# Patient Record
Sex: Female | Born: 1995 | Hispanic: Yes | Marital: Single | Smoking: Never smoker
Health system: Southern US, Community
[De-identification: ages and names within clinical notes are randomized; demographics above are authoritative.]

## PROBLEM LIST (undated history)

## (undated) DIAGNOSIS — A749 Chlamydial infection, unspecified: Secondary | ICD-10-CM

## (undated) DIAGNOSIS — K3580 Unspecified acute appendicitis: Secondary | ICD-10-CM

## (undated) HISTORY — DX: Unspecified acute appendicitis: K35.80

## (undated) HISTORY — DX: Chlamydial infection, unspecified: A74.9

---
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
## 2015-04-03 ENCOUNTER — Encounter: Payer: Self-pay | Admitting: Emergency Medicine

## 2015-04-03 ENCOUNTER — Ambulatory Visit
Admission: EM | Admit: 2015-04-03 | Discharge: 2015-04-03 | Disposition: A | Discharge status: Home / Self Care | Payer: Self-pay | Attending: Family Medicine | Admitting: Family Medicine

## 2015-04-03 DIAGNOSIS — J039 Acute tonsillitis, unspecified: Secondary | ICD-10-CM

## 2015-04-03 DIAGNOSIS — H6981 Other specified disorders of Eustachian tube, right ear: Secondary | ICD-10-CM

## 2015-04-03 LAB — CBC WITH DIFFERENTIAL/PLATELET
BASOS ABS: 0 10*3/uL (ref 0–0.1)
BASOS PCT: 0 %
EOS PCT: 1 %
Eosinophils Absolute: 0.1 10*3/uL (ref 0–0.7)
HCT: 34.7 % — ABNORMAL LOW (ref 35.0–47.0)
Hemoglobin: 11.3 g/dL — ABNORMAL LOW (ref 12.0–16.0)
Lymphocytes Relative: 18 %
Lymphs Abs: 1.7 10*3/uL (ref 1.0–3.6)
MCH: 26 pg (ref 26.0–34.0)
MCHC: 32.5 g/dL (ref 32.0–36.0)
MCV: 80 fL (ref 80.0–100.0)
MONO ABS: 0.7 10*3/uL (ref 0.2–0.9)
Monocytes Relative: 7 %
Neutro Abs: 6.7 10*3/uL — ABNORMAL HIGH (ref 1.4–6.5)
Neutrophils Relative %: 74 %
PLATELETS: 349 10*3/uL (ref 150–440)
RBC: 4.34 MIL/uL (ref 3.80–5.20)
RDW: 17 % — AB (ref 11.5–14.5)
WBC: 9.2 10*3/uL (ref 3.6–11.0)

## 2015-04-03 LAB — RAPID STREP SCREEN (MED CTR MEBANE ONLY): Streptococcus, Group A Screen (Direct): NEGATIVE

## 2015-04-03 LAB — MONONUCLEOSIS SCREEN: MONO SCREEN: NEGATIVE

## 2015-04-03 MED ORDER — FLUTICASONE PROPIONATE 50 MCG/ACT NA SUSP: 2.0000 | Freq: Every day | NASAL | Status: DC

## 2015-04-03 MED ORDER — KETOROLAC TROMETHAMINE 60 MG/2ML IM SOLN
60.0000 mg | Freq: Once | INTRAMUSCULAR | Status: AC
  Administered 2015-04-03: 60 mg via INTRAMUSCULAR

## 2015-04-03 MED ORDER — AZITHROMYCIN 250 MG PO TABS: 500.0000 mg | ORAL_TABLET | Freq: Every day | ORAL | Status: DC

## 2015-04-03 NOTE — Discharge Instructions (Signed)
° °  Call in 3 days for final Strep report  Azithromycin 2 tablets daily for 5 days  Ibuprofen 600 mg ( 3 x 200mg  ) 3 times a day with meals for 3-4 days  Alternate with Tylenol 500 mg 2 tablets if desired  Warm salt water gargles  Tonsillitis Tonsillitis is an infection of the throat. This infection causes the tonsils to become red, tender, and puffy (swollen). Tonsils are groups of tissue at the back of your throat. If bacteria caused your infection, antibiotic medicine will be given to you. Sometimes symptoms of tonsillitis can be relieved with the use of steroid medicine. If your tonsillitis is severe and happens often, you may need to get your tonsils removed (tonsillectomy). HOME CARE   Rest and sleep often.  Drink enough fluids to keep your pee (urine) clear or pale yellow.  While your throat is sore, eat soft or liquid foods like:  Soup.  Ice cream.  Instant breakfast drinks.  Eat frozen ice pops.  Gargle with a warm or cold liquid to help soothe the throat. Gargle with a water and salt mix. Mix 1/4 teaspoon of salt and 1/4 teaspoon of baking soda in 1 cup of water.  Only take medicines as told by your doctor.  If you are given medicines (antibiotics), take them as told. Finish them even if you start to feel better. GET HELP IF:  You have large, tender lumps in your neck.  You have a rash.  You cough up green, yellow-brown, or bloody fluid.  You cannot swallow liquids or food for 24 hours.  You notice that only one of your tonsils is swollen. GET HELP RIGHT AWAY IF:   You throw up (vomit).  You have a very bad headache.  You have a stiff neck.  You have chest pain.  You have trouble breathing or swallowing.  You have bad throat pain, drooling, or your voice changes.  You have bad pain not helped by medicine.  You cannot fully open your mouth.  You have redness, puffiness, or bad pain in the neck.  You have a fever. MAKE SURE YOU:   Understand  these instructions.  Will watch your condition.  Will get help right away if you are not doing well or get worse.   This information is not intended to replace advice given to you by your health care provider. Make sure you discuss any questions you have with your health care provider.   Document Released: 11/03/2007 Document Revised: 05/22/2013 Document Reviewed: 11/03/2012 Elsevier Interactive Patient Education [employer]2016 Elsevier Inc.

## 2015-04-03 NOTE — ED Notes (Signed)
Pt with a sore throat x 1 week

## 2015-04-03 NOTE — ED Provider Notes (Signed)
CSN: [id number]     Arrival date & time 04/03/15  1400 History   First MD Initiated Contact with Patient 04/03/15 1505     Chief Complaint  Patient presents with  . Sore Throat   (Consider location/radiation/quality/duration/timing/severity/associated sxs/prior Treatment) HPI  19 yo F reports a week of right sided sore throat pain. Last week the left side hurt but that has resolved. Mother told her  she had a fever- no idea what. Using warm tea/honey- no ibuprofen or tylenol. Appetite is decreased, doesn't want to eat because of pain, Can drink but uncomfortable. Voiding. Right sided ear pain and eustachian tube dysfunction. Denies hx of seasonal allergies. Denies friends with similar symptoms. Attends [employer] and has missed classes. Boyfriend present in room. History reviewed. No pertinent past medical history. History reviewed. No pertinent past surgical history. Family History  Problem Relation Age of Onset  . Diabetes Maternal Grandmother    Social History  Substance Use Topics  . Smoking status: Never Smoker   . Smokeless tobacco: None  . Alcohol Use: No   OB History    No data available     Review of Systems Constitutional: No fever today Eyes: No visual changes. WUJ:WJXB sore throat, right side;  Cardiovascular:Negative for chest pain/palpitations Respiratory: Negative for shortness of breath Gastrointestinal: No abdominal pain. No nausea,vomiting, diarrhea Genitourinary: Negative for dysuria. Normal urination. Musculoskeletal: Negative for back pain. FROM extremities without pain Skin: Negative for rash Neurological: Negative for headache, focal weakness or numbness  Allergies  Review of patient's allergies indicates no known allergies.  Home Medications   Prior to Admission medications   Medication Sig Start Date End Date Taking? Authorizing Provider  azithromycin (ZITHROMAX) 250 MG tablet Take 2 tablets (500 mg total) by mouth daily. Take first 2 tablets together  for 5 days 04/03/15   Rae Halsted, PA-C  fluticasone [facility]) 50 MCG/ACT nasal spray Place 2 sprays into both nostrils daily. To open passages and help relieve right ear pressure 04/03/15   Rae Halsted, PA-C   Meds Ordered and Administered this Visit   Medications  ketorolac (TORADOL) injection 60 mg (60 mg Intramuscular Given 04/03/15 1525)  well tolerated with good pain relief in about 30 minutes  BP 101/60 mmHg  Pulse 81  Temp(Src) 97.9 F (36.6 C) (Tympanic)  Resp 18  Ht 5' 5.5" (1.664 m)  Wt 165 lb (74.844 kg)  BMI 27.03 kg/m2  SpO2 100%  LMP 03/07/2015 No data found.   Physical Exam Constitutional: Alert and oriented, complaining of sore throart, VS are noted,  General : Mild distress; Head:normocephalic, atraumatic,  Eyes: conjugate gaze,negative conjunctiva,  Ears:Grossly normal hearing,valsalva yields pop/squeak pain right ear, Right TM retracted,  Bilateral canals  WNL  Nose:normal Mouth/throat :Mucous membranes moist, Grossly enlarged right tonsillar pillar, left moderately enlarged; no exudate noted either Neck :  supple , tender anterior lymphadenopathy; post chain negative Heart: Normal rate, regular rhythm p96 Lung:    Normal respiratory effort and rate , no distress Back:    No CVAT, no spinal tenderness noted Abd :    soft, non-tender, bowel sounds present, no guarding,rebound or organomegaly  Appreciated, no splenic tenderness MSK:   nontender, normal ROM all extremities; ambulatory in unit, on and off table without assistance Neuro:Face symmetric, EOMI, PERRLA,tongue midline. Grossly intact; good attention and recall,normal gait, normal speech and language Skin:  Warm,dry,intact Psych: mood and affect WNL  ED Course  Procedures (including critical care time)  Labs Review Labs Reviewed  CBC WITH DIFFERENTIAL/PLATELET - Abnormal; Notable for the following:    Hemoglobin 11.3 (*)    HCT 34.7 (*)    RDW 17.0 (*)    Neutro Abs 6.7 (*)    All other  components within normal limits  RAPID STREP SCREEN (NOT AT [facility])  CULTURE, GROUP A STREP ([facility] ONLY)  MONONUCLEOSIS SCREEN   Results for orders placed or performed during the hospital encounter of 04/03/15  Rapid strep screen  Result Value Ref Range   Streptococcus, Group A Screen (Direct) NEGATIVE NEGATIVE  CBC with Differential  Result Value Ref Range   WBC 9.2 3.6 - 11.0 K/uL   RBC 4.34 3.80 - 5.20 MIL/uL   Hemoglobin 11.3 (L) 12.0 - 16.0 g/dL   HCT 40.934.7 (L) 81.135.0 - 91.447.0 %   MCV 80.0 80.0 - 100.0 fL   MCH 26.0 26.0 - 34.0 pg   MCHC 32.5 32.0 - 36.0 g/dL   RDW 78.217.0 (H) 95.611.5 - 21.314.5 %   Platelets 349 150 - 440 K/uL   Neutrophils Relative % 74 %   Neutro Abs 6.7 (H) 1.4 - 6.5 K/uL   Lymphocytes Relative 18 %   Lymphs Abs 1.7 1.0 - 3.6 K/uL   Monocytes Relative 7 %   Monocytes Absolute 0.7 0.2 - 0.9 K/uL   Eosinophils Relative 1 %   Eosinophils Absolute 0.1 0 - 0.7 K/uL   Basophils Relative 0 %   Basophils Absolute 0.0 0 - 0.1 K/uL  Mononucleosis screen  Result Value Ref Range   Mono Screen NEGATIVE NEGATIVE     Imaging Review No results found.      MDM   1. Tonsillitis   2. Eustachian tube dysfunction, right    Plan: Test results and diagnosis reviewed with patient Reminded to call in 3 days for strep culture final Rx as per orders;  benefits, risks, potential side effects reviewed   Recommend supportive treatment with ibuprofen and acetominophen cyclic Increase po fluids, steamy shower, vaporizer Seek additional medical care if symptoms worsen or are not improving  Discharge Medication List as of 04/03/2015  4:04 PM    START taking these medications   Details  azithromycin (ZITHROMAX) 250 MG tablet Take 2 tablets (500 mg total) by mouth daily. Take first 2 tablets together for 5 days, Starting 04/03/2015, Until Discontinued, Normal      Fluticasone nasal spray 2 sparys BID for 3 days , then reduce to qd  Rae HalstedLaurie W Necie Wilcoxson, PA-C 04/03/15 2252

## 2015-04-06 LAB — CULTURE, GROUP A STREP (THRC)

## 2015-04-18 ENCOUNTER — Telehealth: Payer: Self-pay

## 2015-04-18 NOTE — ED Notes (Signed)
Patient contacted and informed had positive strep throat. States "the medicine", Zithromax, had helped. "I feel great"

## 2017-02-14 ENCOUNTER — Encounter: Payer: Self-pay | Admitting: Emergency Medicine

## 2017-02-14 ENCOUNTER — Observation Stay
Admission: EM | Admit: 2017-02-14 | Discharge: 2017-02-15 | Disposition: A | Discharge status: Home / Self Care | Payer: Commercial Managed Care - PPO | Attending: Surgery | Admitting: Surgery

## 2017-02-14 ENCOUNTER — Emergency Department: Payer: Commercial Managed Care - PPO

## 2017-02-14 DIAGNOSIS — K3580 Unspecified acute appendicitis: Secondary | ICD-10-CM | POA: Diagnosis not present

## 2017-02-14 DIAGNOSIS — K353 Acute appendicitis with localized peritonitis, without perforation or gangrene: Secondary | ICD-10-CM | POA: Diagnosis present

## 2017-02-14 LAB — URINALYSIS, COMPLETE (UACMP) WITH MICROSCOPIC
BILIRUBIN URINE: NEGATIVE
Bacteria, UA: NONE SEEN
Glucose, UA: NEGATIVE mg/dL
Hgb urine dipstick: NEGATIVE
Ketones, ur: 20 mg/dL — AB
LEUKOCYTES UA: NEGATIVE
NITRITE: NEGATIVE
PROTEIN: 30 mg/dL — AB
Specific Gravity, Urine: 1.027 (ref 1.005–1.030)
pH: 6 (ref 5.0–8.0)

## 2017-02-14 LAB — COMPREHENSIVE METABOLIC PANEL
ALK PHOS: 76 U/L (ref 38–126)
ALT: 27 U/L (ref 14–54)
ANION GAP: 8 (ref 5–15)
AST: 31 U/L (ref 15–41)
Albumin: 4.4 g/dL (ref 3.5–5.0)
BILIRUBIN TOTAL: 0.4 mg/dL (ref 0.3–1.2)
BUN: 10 mg/dL (ref 6–20)
CALCIUM: 9.2 mg/dL (ref 8.9–10.3)
CO2: 24 mmol/L (ref 22–32)
CREATININE: 0.61 mg/dL (ref 0.44–1.00)
Chloride: 105 mmol/L (ref 101–111)
GFR calc non Af Amer: >60 mL/min (ref >60)
Glucose, Bld: 147 mg/dL — ABNORMAL HIGH (ref 65–99)
Potassium: 3.9 mmol/L (ref 3.5–5.1)
Sodium: 137 mmol/L (ref 135–145)
TOTAL PROTEIN: 8 g/dL (ref 6.5–8.1)

## 2017-02-14 LAB — CBC
HCT: 39.5 % (ref 35.0–47.0)
HEMOGLOBIN: 13.3 g/dL (ref 12.0–16.0)
MCH: 30.4 pg (ref 26.0–34.0)
MCHC: 33.6 g/dL (ref 32.0–36.0)
MCV: 90.6 fL (ref 80.0–100.0)
PLATELETS: 248 10*3/uL (ref 150–440)
RBC: 4.36 MIL/uL (ref 3.80–5.20)
RDW: 14.1 % (ref 11.5–14.5)
WBC: 15.9 10*3/uL — AB (ref 3.6–11.0)

## 2017-02-14 LAB — LIPASE, BLOOD: Lipase: 18 U/L (ref 11–51)

## 2017-02-14 LAB — POCT PREGNANCY, URINE: PREG TEST UR: NEGATIVE

## 2017-02-14 LAB — PREGNANCY, URINE: Preg Test, Ur: NEGATIVE

## 2017-02-14 MED ORDER — IOPAMIDOL (ISOVUE-300) INJECTION 61%
100.0000 mL | Freq: Once | INTRAVENOUS | Status: AC | PRN
  Administered 2017-02-14: 100 mL via INTRAVENOUS
  Filled 2017-02-14: qty 100

## 2017-02-14 MED ORDER — ENOXAPARIN SODIUM 40 MG/0.4ML ~~LOC~~ SOLN
40.0000 mg | SUBCUTANEOUS | Status: DC
  Administered 2017-02-15: 40 mg via SUBCUTANEOUS
  Filled 2017-02-14: qty 0.4

## 2017-02-14 MED ORDER — KETOROLAC TROMETHAMINE 30 MG/ML IJ SOLN
30.0000 mg | Freq: Four times a day (QID) | INTRAMUSCULAR | Status: DC
  Administered 2017-02-15 (×2): 30 mg via INTRAVENOUS
  Filled 2017-02-14 (×2): qty 1

## 2017-02-14 MED ORDER — IOPAMIDOL (ISOVUE-300) INJECTION 61%
30.0000 mL | Freq: Once | INTRAVENOUS | Status: AC
  Administered 2017-02-14: 30 mL via ORAL
  Filled 2017-02-14: qty 30

## 2017-02-14 MED ORDER — LACTATED RINGERS IV SOLN
125.0000 mL/h | INTRAVENOUS | Status: DC
  Administered 2017-02-15: 125 mL/h via INTRAVENOUS

## 2017-02-14 MED ORDER — HYDROMORPHONE HCL 1 MG/ML IJ SOLN: 0.5000 mg | INTRAMUSCULAR | Status: DC | PRN

## 2017-02-14 MED ORDER — PIPERACILLIN-TAZOBACTAM 3.375 G IVPB 30 MIN
INTRAVENOUS | Status: AC
Start: 2017-02-14 — End: 2017-02-14
  Administered 2017-02-14: 3.375 g via INTRAVENOUS
  Filled 2017-02-14: qty 50

## 2017-02-14 MED ORDER — PIPERACILLIN-TAZOBACTAM 3.375 G IVPB
3.3750 g | Freq: Three times a day (TID) | INTRAVENOUS | Status: DC
  Administered 2017-02-15: 3.375 g via INTRAVENOUS
  Filled 2017-02-14: qty 50

## 2017-02-14 MED ORDER — ONDANSETRON HCL 4 MG/2ML IJ SOLN: 4.0000 mg | Freq: Four times a day (QID) | INTRAMUSCULAR | Status: DC | PRN

## 2017-02-14 MED ORDER — PANTOPRAZOLE SODIUM 40 MG IV SOLR
40.0000 mg | Freq: Every day | INTRAVENOUS | Status: DC
  Administered 2017-02-15: 40 mg via INTRAVENOUS
  Filled 2017-02-14: qty 40

## 2017-02-14 MED ORDER — ONDANSETRON 4 MG PO TBDP: 4.0000 mg | ORAL_TABLET | Freq: Four times a day (QID) | ORAL | Status: DC | PRN

## 2017-02-14 MED ORDER — PIPERACILLIN-TAZOBACTAM 3.375 G IVPB 30 MIN
3.3750 g | Freq: Once | INTRAVENOUS | Status: AC
  Administered 2017-02-14: 3.375 g via INTRAVENOUS

## 2017-02-14 NOTE — H&P (Signed)
Date of Admission:  02/14/2017  Reason for Admission:  Acute appendicitis  History of Present Illness: Diane Beasley is a 21 y.o. female who presents with a one day history of abdominal pain.  Pain started this morning when she woke up.  Initially it was in the left lower quadrant.  She had a bowel movement but it did not help with her pain.  Her mother then made her some tea which she ended up throwing up.  Her pain over the course of the day has migrated towards the right lower quadrant.  She reports having some chills at home and the ED.  Did have another episode of emesis in the ED after drinking the contrast.  Denies other areas of pain.  Denies chest pain or shortness of breath, but does hurt in the right lower quadrant when taking a deep breath.  Denies hematuria, dysuria, constipation or diarrhea.  Past Medical History: -None  Past Surgical History: -Left shoulder surgery for superficial clot  Home Medications: Prior to Admission medications   None    Allergies: No Known Allergies  Social History:  reports that she has never smoked. She has never used smokeless tobacco. She reports that she does not drink alcohol or use drugs.   Family History: Family History  Problem Relation Age of Onset  . Diabetes Maternal Grandmother     Review of Systems: Review of Systems  Constitutional: Positive for chills. Negative for fever.  HENT: Negative for hearing loss.   Eyes: Negative for blurred vision.  Respiratory: Negative for cough and shortness of breath.   Cardiovascular: Negative for chest pain.  Gastrointestinal: Positive for abdominal pain, nausea and vomiting. Negative for constipation and diarrhea.  Genitourinary: Negative for dysuria.  Musculoskeletal: Negative for myalgias.  Skin: Negative for rash.  Neurological: Negative for dizziness.  Psychiatric/Behavioral: Negative for depression.  All other systems reviewed and are negative.   Physical  Exam BP 116/67 (BP Location: Right Arm)   Pulse (!) 57   Temp 98 F (36.7 C) (Oral)   Resp 16   Wt 89.3 kg (196 lb 14.3 oz)   LMP 01/12/2017   SpO2 100%   BMI 32.27 kg/m  CONSTITUTIONAL: No acute distress HEENT:  Normocephalic, atraumatic, extraocular motion intact. NECK: Trachea is midline, and there is no jugular venous distension.  RESPIRATORY:  Lungs are clear, and breath sounds are equal bilaterally. Normal respiratory effort without pathologic use of accessory muscles. CARDIOVASCULAR: Heart is regular without murmurs, gallops, or rubs. GI: The abdomen is soft, nondistended, with tenderness to palpation in the right lower quadrant. There were no palpable masses.  MUSCULOSKELETAL:  Normal muscle strength and tone in all four extremities.  No peripheral edema or cyanosis. SKIN: Skin turgor is normal. There are no pathologic skin lesions.  NEUROLOGIC:  Motor and sensation is grossly normal.  Cranial nerves are grossly intact. PSYCH:  Alert and oriented to person, place and time. Affect is normal.  Laboratory Analysis: Results for orders placed or performed during the hospital encounter of 02/14/17 (from the past 24 hour(s))  Lipase, blood     Status: None   Collection Time: 02/14/17  3:00 PM  Result Value Ref Range   Lipase 18 11 - 51 U/L  Comprehensive metabolic panel     Status: Abnormal   Collection Time: 02/14/17  3:00 PM  Result Value Ref Range   Sodium 137 135 - 145 mmol/L   Potassium 3.9 3.5 - 5.1 mmol/L   Chloride 105  101 - 111 mmol/L   CO2 24 22 - 32 mmol/L   Glucose, Bld 147 (H) 65 - 99 mg/dL   BUN 10 6 - 20 mg/dL   Creatinine, Ser 1.61 0.44 - 1.00 mg/dL   Calcium 9.2 8.9 - 09.6 mg/dL   Total Protein 8.0 6.5 - 8.1 g/dL   Albumin 4.4 3.5 - 5.0 g/dL   AST 31 15 - 41 U/L   ALT 27 14 - 54 U/L   Alkaline Phosphatase 76 38 - 126 U/L   Total Bilirubin 0.4 0.3 - 1.2 mg/dL   GFR calc non Af Amer >60 >60 mL/min   GFR calc Af Amer >60 >60 mL/min   Anion gap 8 5 - 15   CBC     Status: Abnormal   Collection Time: 02/14/17  3:00 PM  Result Value Ref Range   WBC 15.9 (H) 3.6 - 11.0 K/uL   RBC 4.36 3.80 - 5.20 MIL/uL   Hemoglobin 13.3 12.0 - 16.0 g/dL   HCT 04.5 40.9 - 81.1 %   MCV 90.6 80.0 - 100.0 fL   MCH 30.4 26.0 - 34.0 pg   MCHC 33.6 32.0 - 36.0 g/dL   RDW 91.4 78.2 - 95.6 %   Platelets 248 150 - 440 K/uL  Urinalysis, Complete w Microscopic     Status: Abnormal   Collection Time: 02/14/17  3:12 PM  Result Value Ref Range   Color, Urine YELLOW (A) YELLOW   APPearance HAZY (A) CLEAR   Specific Gravity, Urine 1.027 1.005 - 1.030   pH 6.0 5.0 - 8.0   Glucose, UA NEGATIVE NEGATIVE mg/dL   Hgb urine dipstick NEGATIVE NEGATIVE   Bilirubin Urine NEGATIVE NEGATIVE   Ketones, ur 20 (A) NEGATIVE mg/dL   Protein, ur 30 (A) NEGATIVE mg/dL   Nitrite NEGATIVE NEGATIVE   Leukocytes, UA NEGATIVE NEGATIVE   RBC / HPF 0-5 0 - 5 RBC/hpf   WBC, UA 0-5 0 - 5 WBC/hpf   Bacteria, UA NONE SEEN NONE SEEN   Squamous Epithelial / LPF 6-30 (A) NONE SEEN   Mucus PRESENT   Pregnancy, urine     Status: None   Collection Time: 02/14/17  3:12 PM  Result Value Ref Range   Preg Test, Ur NEGATIVE NEGATIVE  Pregnancy, urine POC     Status: None   Collection Time: 02/14/17  5:42 PM  Result Value Ref Range   Preg Test, Ur NEGATIVE NEGATIVE    Imaging: Ct Abdomen Pelvis W Contrast  Result Date: 02/14/2017 CLINICAL DATA:  Bilateral lower abdominal pain today. EXAM: CT ABDOMEN AND PELVIS WITH CONTRAST TECHNIQUE: Multidetector CT imaging of the abdomen and pelvis was performed using the standard protocol following bolus administration of intravenous contrast. CONTRAST:  ISOVUE-300 IOPAMIDOL (ISOVUE-300) INJECTION 61% COMPARISON:  None. FINDINGS: Lower chest: The lung bases are clear. Hepatobiliary: No focal liver abnormality is seen. No gallstones, gallbladder wall thickening, or biliary dilatation. Pancreas: Unremarkable. No pancreatic ductal dilatation or surrounding  inflammatory changes. Spleen: Normal in size without focal abnormality. Adrenals/Urinary Tract: Adrenal glands are unremarkable. Kidneys are normal, without renal calculi, focal lesion, or hydronephrosis. Bladder is unremarkable. Stomach/Bowel: Stomach, small bowel, and colon are decompressed. The appendix is distended and fluid filled with maximal diameter of about 12 mm. There is periappendiceal infiltration in the right lower quadrant fat. Changes are consistent with acute appendicitis. No abscess. New scattered lymph nodes in the right lower quadrant are likely reactive. Vascular/Lymphatic: No significant vascular findings are present.  No enlarged abdominal or pelvic lymph nodes. Reproductive: Uterus and ovaries are not enlarged. Small amount of free fluid in the pelvis is likely physiologic. Other: No free air in the abdomen. Abdominal wall musculature appears intact. Musculoskeletal: No acute or significant osseous findings. IMPRESSION: Changes of acute appendicitis with periappendiceal fat stranding. No abscess. Electronically Signed   By: Burman Nieves M.D.   On: 02/14/2017 21:59    Assessment and Plan: This is a 21 y.o. female who presents with acute appendicitis.  I have independently viewed the patient's imaging study and reviewed her laboratory studies.  Her WBC is elevated at 15.9, and her CT shows a dilated appendix with surrounding stranding consistent with appendicitis.  Does not appear to be ruptured.  Patient will be admitted to surgical team and go to OR in the morning.  She will be NPO with IV fluid hydration.  Will be started on IV antibiotics in ED and continued.  Will have appropriate pain and nausea control.  Discussed with her that my partner, Dr. Earlene Plater, would be performing the surgery in the morning.  Discussed the role of laparoscopic appendectomy in her treatment plan.  Patient understands this plan and all of her questions have been answered.   Howie Ill,  MD [facility]

## 2017-02-14 NOTE — ED Provider Notes (Signed)
[facility] Emergency Department Provider Note  ____________________________________________   I have reviewed the triage vital signs and the nursing notes.   HISTORY  Chief Complaint Abdominal Pain   History limited by: Not Limited   HPI Diane Beasley is a 21 y.o. female who presents to the emergency department today because of concerns for abdominal pain. Is located primarily in the right lower quadrant. The patient states the pain started this morning. Initially was more in the left side of her abdomen. Throughout the day however does migrated to the right side. She describes it as severe. It is worse with deep breaths or movement. Patient describes a lack of appetite today. She has had associated nausea and vomiting. She denies any change in defecation. No change in urination or abnormal vaginal discharge. She denies any fevers.   History reviewed. No pertinent past medical history.  There are no active problems to display for this patient.   History reviewed. No pertinent surgical history.  Prior to Admission medications   Medication Sig Start Date End Date Taking? Authorizing Provider  azithromycin (ZITHROMAX) 250 MG tablet Take 2 tablets (500 mg total) by mouth daily. Take first 2 tablets together for 5 days 04/03/15   Rae Halsted, PA-C  fluticasone [facility]) 50 MCG/ACT nasal spray Place 2 sprays into both nostrils daily. To open passages and help relieve right ear pressure 04/03/15   Rae Halsted, PA-C    Allergies Patient has no known allergies.  Family History  Problem Relation Age of Onset  . Diabetes Maternal Grandmother     Social History Social History  Substance Use Topics  . Smoking status: Never Smoker  . Smokeless tobacco: Never Used  . Alcohol use No    Review of Systems Constitutional: No fever/chills Eyes: No visual changes. ENT: No sore throat. Cardiovascular: Denies chest pain. Respiratory: Denies  shortness of breath. Gastrointestinal: No abdominal pain.  No nausea, no vomiting.  No diarrhea.   Genitourinary: Negative for dysuria. Musculoskeletal: Negative for back pain. Skin: Negative for rash. Neurological: Negative for headaches, focal weakness or numbness.  ____________________________________________   PHYSICAL EXAM:  VITAL SIGNS: ED Triage Vitals  Enc Vitals Group     BP 02/14/17 1508 116/67     Pulse Rate 02/14/17 1508 (!) 57     Resp 02/14/17 1508 16     Temp 02/14/17 1508 98 F (36.7 C)     Temp Source 02/14/17 1508 Oral     SpO2 02/14/17 1508 100 %     Weight 02/14/17 1510 196 lb 14.3 oz (89.3 kg)     Height --      Head Circumference --      Peak Flow --      Pain Score 02/14/17 1508 10     Pain Loc --      Pain Edu? --      Excl. in GC? --      Constitutional: Alert and oriented. Well appearing and in no distress. Eyes: Conjunctivae are normal.  ENT   Head: Normocephalic and atraumatic.   Nose: No congestion/rhinnorhea.   Mouth/Throat: Mucous membranes are moist.   Neck: No stridor. Hematological/Lymphatic/Immunilogical: No cervical lymphadenopathy. Cardiovascular: Normal rate, regular rhythm.  No murmurs, rubs, or gallops.  Respiratory: Normal respiratory effort without tachypnea nor retractions. Breath sounds are clear and equal bilaterally. No wheezes/rales/rhonchi. Gastrointestinal: Soft and non tender. No rebound. No guarding.  Genitourinary: Deferred Musculoskeletal: Normal range of motion in all extremities. No  lower extremity edema. Neurologic:  Normal speech and language. No gross focal neurologic deficits are appreciated.  Skin:  Skin is warm, dry and intact. No rash noted. Psychiatric: Mood and affect are normal. Speech and behavior are normal. Patient exhibits appropriate insight and judgment.  ____________________________________________    LABS (pertinent positives/negatives)  UA Color: yellow Appearance: hazy Hgb:  neg Ketones: 20 Nitrite: neg Leukocytes: neg RBC: 0-5 WBC: 0-5 Bacteria: none Squamous Epi: 6-30 CBC WBC: 15.9 Hgb: 13.3 Plt: 248 CMP WNL except Glu 147 Lipase 18   ____________________________________________   EKG  None  ____________________________________________    RADIOLOGY  CT abd/pel Acute appendicitis  I, Chyrel Taha, personally viewed and evaluated these images (plain radiographs) as part of my medical decision making. ____________________________________________   PROCEDURES  Procedures  ____________________________________________   INITIAL IMPRESSION / ASSESSMENT AND PLAN / ED COURSE  Pertinent labs & imaging results that were available during my care of the patient were reviewed by me and considered in my medical decision making (see chart for details).  patient presented with lower abdominal pain. CT scan does show acute appendicitis. Patient will be admitted to the surgical service.  ____________________________________________   FINAL CLINICAL IMPRESSION(S) / ED DIAGNOSES  Final diagnoses:  Acute appendicitis, unspecified acute appendicitis type     Note: This dictation was prepared with Dragon dictation. Any transcriptional errors that result from this process are unintentional     Phineas Semen, MD 02/14/17 2240

## 2017-02-14 NOTE — ED Triage Notes (Signed)
Pt to ed with c/o lower bilat abd pain.

## 2017-02-15 ENCOUNTER — Encounter: Payer: Self-pay | Admitting: *Deleted

## 2017-02-15 ENCOUNTER — Observation Stay: Payer: Commercial Managed Care - PPO | Admitting: Certified Registered"

## 2017-02-15 ENCOUNTER — Encounter
Admission: EM | Disposition: A | Discharge status: Home / Self Care | Payer: Self-pay | Source: Home / Self Care | Attending: Emergency Medicine

## 2017-02-15 DIAGNOSIS — K353 Acute appendicitis with localized peritonitis: Principal | ICD-10-CM

## 2017-02-15 HISTORY — PX: LAPAROSCOPIC APPENDECTOMY: SHX408

## 2017-02-15 LAB — CBC
HEMATOCRIT: 36.8 % (ref 35.0–47.0)
Hemoglobin: 12.5 g/dL (ref 12.0–16.0)
MCH: 30.1 pg (ref 26.0–34.0)
MCHC: 34.1 g/dL (ref 32.0–36.0)
MCV: 88.2 fL (ref 80.0–100.0)
Platelets: 248 10*3/uL (ref 150–440)
RBC: 4.17 MIL/uL (ref 3.80–5.20)
RDW: 13.8 % (ref 11.5–14.5)
WBC: 16.7 10*3/uL — AB (ref 3.6–11.0)

## 2017-02-15 LAB — BASIC METABOLIC PANEL
ANION GAP: 7 (ref 5–15)
BUN: 10 mg/dL (ref 6–20)
CHLORIDE: 106 mmol/L (ref 101–111)
CO2: 25 mmol/L (ref 22–32)
Calcium: 9 mg/dL (ref 8.9–10.3)
Creatinine, Ser: 0.68 mg/dL (ref 0.44–1.00)
GFR calc non Af Amer: >60 mL/min (ref >60)
Glucose, Bld: 109 mg/dL — ABNORMAL HIGH (ref 65–99)
POTASSIUM: 3.1 mmol/L — AB (ref 3.5–5.1)
SODIUM: 138 mmol/L (ref 135–145)

## 2017-02-15 LAB — MAGNESIUM: MAGNESIUM: 2.1 mg/dL (ref 1.7–2.4)

## 2017-02-15 SURGERY — APPENDECTOMY, LAPAROSCOPIC
Anesthesia: General

## 2017-02-15 MED ORDER — KETOROLAC TROMETHAMINE 30 MG/ML IJ SOLN
30.0000 mg | Freq: Four times a day (QID) | INTRAMUSCULAR | Status: DC
  Administered 2017-02-15: 30 mg via INTRAVENOUS
  Filled 2017-02-15: qty 1

## 2017-02-15 MED ORDER — ROCURONIUM BROMIDE 50 MG/5ML IV SOLN
INTRAVENOUS | Status: AC
  Filled 2017-02-15: qty 1

## 2017-02-15 MED ORDER — KETOROLAC TROMETHAMINE 30 MG/ML IJ SOLN
INTRAMUSCULAR | Status: AC
  Filled 2017-02-15: qty 2

## 2017-02-15 MED ORDER — SUCCINYLCHOLINE CHLORIDE 20 MG/ML IJ SOLN
INTRAMUSCULAR | Status: AC
  Filled 2017-02-15: qty 1

## 2017-02-15 MED ORDER — MIDAZOLAM HCL 2 MG/2ML IJ SOLN
INTRAMUSCULAR | Status: AC
  Filled 2017-02-15: qty 2

## 2017-02-15 MED ORDER — DEXAMETHASONE SODIUM PHOSPHATE 10 MG/ML IJ SOLN
INTRAMUSCULAR | Status: DC | PRN
  Administered 2017-02-15: 4 mg via INTRAVENOUS

## 2017-02-15 MED ORDER — LIDOCAINE HCL (CARDIAC) 20 MG/ML IV SOLN
INTRAVENOUS | Status: DC | PRN
  Administered 2017-02-15: 40 mg via INTRAVENOUS

## 2017-02-15 MED ORDER — POTASSIUM CHLORIDE CRYS ER 20 MEQ PO TBCR
40.0000 meq | EXTENDED_RELEASE_TABLET | Freq: Once | ORAL | Status: AC
  Administered 2017-02-15: 40 meq via ORAL
  Filled 2017-02-15: qty 2

## 2017-02-15 MED ORDER — SUCCINYLCHOLINE CHLORIDE 20 MG/ML IJ SOLN
INTRAMUSCULAR | Status: DC | PRN
  Administered 2017-02-15: 80 mg via INTRAVENOUS

## 2017-02-15 MED ORDER — LACTATED RINGERS IV SOLN
INTRAVENOUS | Status: DC | PRN
  Administered 2017-02-15: 09:00:00 via INTRAVENOUS

## 2017-02-15 MED ORDER — ONDANSETRON HCL 4 MG/2ML IJ SOLN: 4.0000 mg | Freq: Once | INTRAMUSCULAR | Status: DC | PRN

## 2017-02-15 MED ORDER — ROCURONIUM BROMIDE 100 MG/10ML IV SOLN
INTRAVENOUS | Status: DC | PRN
  Administered 2017-02-15: 40 mg via INTRAVENOUS
  Administered 2017-02-15: 5 mg via INTRAVENOUS

## 2017-02-15 MED ORDER — FENTANYL CITRATE (PF) 100 MCG/2ML IJ SOLN
INTRAMUSCULAR | Status: DC | PRN
  Administered 2017-02-15: 25 ug via INTRAVENOUS
  Administered 2017-02-15: 75 ug via INTRAVENOUS
  Administered 2017-02-15: 25 ug via INTRAVENOUS

## 2017-02-15 MED ORDER — METRONIDAZOLE IN NACL 5-0.79 MG/ML-% IV SOLN
500.0000 mg | Freq: Three times a day (TID) | INTRAVENOUS | Status: DC
  Administered 2017-02-15: 500 mg via INTRAVENOUS
  Filled 2017-02-15 (×4): qty 100

## 2017-02-15 MED ORDER — MIDAZOLAM HCL 2 MG/2ML IJ SOLN
INTRAMUSCULAR | Status: DC | PRN
  Administered 2017-02-15: 2 mg via INTRAVENOUS

## 2017-02-15 MED ORDER — ACETAMINOPHEN 325 MG PO TABS: 650.0000 mg | ORAL_TABLET | Freq: Four times a day (QID) | ORAL | Status: DC | PRN

## 2017-02-15 MED ORDER — OXYCODONE-ACETAMINOPHEN 5-325 MG PO TABS: 1.0000 | ORAL_TABLET | ORAL | 0 refills | Status: DC | PRN

## 2017-02-15 MED ORDER — ONDANSETRON HCL 4 MG/2ML IJ SOLN
INTRAMUSCULAR | Status: DC | PRN
  Administered 2017-02-15: 4 mg via INTRAVENOUS

## 2017-02-15 MED ORDER — KETOROLAC TROMETHAMINE 30 MG/ML IJ SOLN
INTRAMUSCULAR | Status: DC | PRN
  Administered 2017-02-15: 30 mg via INTRAVENOUS

## 2017-02-15 MED ORDER — POTASSIUM CHLORIDE 10 MEQ/100ML IV SOLN
10.0000 meq | INTRAVENOUS | Status: AC
  Administered 2017-02-15: 10 meq via INTRAVENOUS
  Filled 2017-02-15 (×2): qty 100

## 2017-02-15 MED ORDER — BUPIVACAINE HCL (PF) 0.5 % IJ SOLN
INTRAMUSCULAR | Status: AC
  Filled 2017-02-15: qty 30

## 2017-02-15 MED ORDER — ACETAMINOPHEN 650 MG RE SUPP
650.0000 mg | Freq: Four times a day (QID) | RECTAL | Status: DC | PRN
  Filled 2017-02-15: qty 1

## 2017-02-15 MED ORDER — ONDANSETRON HCL 4 MG/2ML IJ SOLN: 4.0000 mg | Freq: Four times a day (QID) | INTRAMUSCULAR | Status: DC | PRN

## 2017-02-15 MED ORDER — SUGAMMADEX SODIUM 200 MG/2ML IV SOLN
INTRAVENOUS | Status: DC | PRN
  Administered 2017-02-15: 165 mg via INTRAVENOUS

## 2017-02-15 MED ORDER — LIDOCAINE HCL (PF) 2 % IJ SOLN
INTRAMUSCULAR | Status: AC
  Filled 2017-02-15: qty 2

## 2017-02-15 MED ORDER — FENTANYL CITRATE (PF) 100 MCG/2ML IJ SOLN
INTRAMUSCULAR | Status: AC
  Filled 2017-02-15: qty 2

## 2017-02-15 MED ORDER — PROPOFOL 10 MG/ML IV BOLUS
INTRAVENOUS | Status: AC
  Filled 2017-02-15: qty 20

## 2017-02-15 MED ORDER — ONDANSETRON 4 MG PO TBDP: 4.0000 mg | ORAL_TABLET | Freq: Four times a day (QID) | ORAL | Status: DC | PRN

## 2017-02-15 MED ORDER — OXYCODONE-ACETAMINOPHEN 5-325 MG PO TABS
1.0000 | ORAL_TABLET | ORAL | Status: DC | PRN
  Administered 2017-02-15: 1 via ORAL
  Filled 2017-02-15: qty 1

## 2017-02-15 MED ORDER — INFLUENZA VAC SPLIT QUAD 0.5 ML IM SUSY: 0.5000 mL | PREFILLED_SYRINGE | INTRAMUSCULAR | Status: DC

## 2017-02-15 MED ORDER — FENTANYL CITRATE (PF) 100 MCG/2ML IJ SOLN: 25.0000 ug | INTRAMUSCULAR | Status: DC | PRN

## 2017-02-15 MED ORDER — LIDOCAINE HCL (PF) 1 % IJ SOLN
INTRAMUSCULAR | Status: AC
  Filled 2017-02-15: qty 30

## 2017-02-15 MED ORDER — DEXTROSE 5 % IV SOLN
2.0000 g | INTRAVENOUS | Status: DC
  Administered 2017-02-15: 2 g via INTRAVENOUS
  Filled 2017-02-15 (×2): qty 2

## 2017-02-15 MED ORDER — PROPOFOL 10 MG/ML IV BOLUS
INTRAVENOUS | Status: DC | PRN
  Administered 2017-02-15: 150 mg via INTRAVENOUS

## 2017-02-15 MED ORDER — DEXAMETHASONE SODIUM PHOSPHATE 10 MG/ML IJ SOLN
INTRAMUSCULAR | Status: AC
Start: 2017-02-15 — End: 2017-02-15
  Filled 2017-02-15: qty 1

## 2017-02-15 MED ORDER — KCL IN DEXTROSE-NACL 20-5-0.45 MEQ/L-%-% IV SOLN
INTRAVENOUS | Status: DC
  Administered 2017-02-15: 13:00:00 via INTRAVENOUS
  Filled 2017-02-15 (×4): qty 1000

## 2017-02-15 MED ORDER — POTASSIUM CHLORIDE 10 MEQ/100ML IV SOLN
10.0000 meq | INTRAVENOUS | Status: DC
  Administered 2017-02-15: 10 meq via INTRAVENOUS

## 2017-02-15 MED ORDER — ONDANSETRON HCL 4 MG/2ML IJ SOLN
INTRAMUSCULAR | Status: AC
  Filled 2017-02-15: qty 2

## 2017-02-15 SURGICAL SUPPLY — 41 items
BLADE SURG SZ11 CARB STEEL (BLADE) ×2 IMPLANT
CANISTER SUCT 3000ML PPV (MISCELLANEOUS) ×2 IMPLANT
CHLORAPREP W/TINT 26ML (MISCELLANEOUS) ×2 IMPLANT
CUTTER FLEX LINEAR 45M (STAPLE) ×2 IMPLANT
DECANTER SPIKE VIAL GLASS SM (MISCELLANEOUS) ×2 IMPLANT
DEFOGGER SCOPE WARMER CLEARIFY (MISCELLANEOUS) ×2 IMPLANT
DERMABOND ADVANCED (GAUZE/BANDAGES/DRESSINGS) ×1
DERMABOND ADVANCED .7 DNX12 (GAUZE/BANDAGES/DRESSINGS) ×1 IMPLANT
DEVICE TROCAR PUNCTURE CLOSURE (ENDOMECHANICALS) ×2 IMPLANT
ELECT REM PT RETURN 9FT ADLT (ELECTROSURGICAL) ×2
ELECTRODE REM PT RTRN 9FT ADLT (ELECTROSURGICAL) ×1 IMPLANT
FILTER LAP SMOKE EVAC STRL (MISCELLANEOUS) ×2 IMPLANT
GLOVE BIO SURGEON STRL SZ7 (GLOVE) ×2 IMPLANT
GLOVE BIOGEL PI IND STRL 7.5 (GLOVE) ×1 IMPLANT
GLOVE BIOGEL PI INDICATOR 7.5 (GLOVE) ×1
GOWN STRL REUS W/ TWL LRG LVL4 (GOWN DISPOSABLE) ×1 IMPLANT
GOWN STRL REUS W/ TWL XL LVL3 (GOWN DISPOSABLE) ×1 IMPLANT
GOWN STRL REUS W/TWL LRG LVL4 (GOWN DISPOSABLE) ×1
GOWN STRL REUS W/TWL XL LVL3 (GOWN DISPOSABLE) ×1
GRASPER SUT TROCAR 14GX15 (MISCELLANEOUS) ×2 IMPLANT
IRRIGATION STRYKERFLOW (MISCELLANEOUS) ×1 IMPLANT
IRRIGATOR STRYKERFLOW (MISCELLANEOUS) ×2
KIT RM TURNOVER STRD PROC AR (KITS) ×2 IMPLANT
NEEDLE HYPO 22GX1.5 SAFETY (NEEDLE) ×2 IMPLANT
NEEDLE INSUFFLATION 14GA 120MM (NEEDLE) ×2 IMPLANT
NS IRRIG 1000ML POUR BTL (IV SOLUTION) ×2 IMPLANT
PACK LAP CHOLECYSTECTOMY (MISCELLANEOUS) ×2 IMPLANT
POUCH SPECIMEN RETRIEVAL 10MM (ENDOMECHANICALS) ×2 IMPLANT
RELOAD 45 VASCULAR/THIN (ENDOMECHANICALS) IMPLANT
RELOAD STAPLE TA45 3.5 REG BLU (ENDOMECHANICALS) ×2 IMPLANT
SHEARS HARMONIC ACE PLUS 36CM (ENDOMECHANICALS) ×2 IMPLANT
SLEEVE ENDOPATH XCEL 5M (ENDOMECHANICALS) ×2 IMPLANT
SOL .9 NS 3000ML IRR  AL (IV SOLUTION)
SOL .9 NS 3000ML IRR UROMATIC (IV SOLUTION) IMPLANT
SUT MNCRL AB 4-0 PS2 18 (SUTURE) ×2 IMPLANT
SUT VICRYL 0 UR6 27IN ABS (SUTURE) ×2 IMPLANT
SUT VICRYL AB 3-0 FS1 BRD 27IN (SUTURE) ×2 IMPLANT
TRAY FOLEY W/METER SILVER 16FR (SET/KITS/TRAYS/PACK) ×2 IMPLANT
TROCAR XCEL 12X100 BLDLESS (ENDOMECHANICALS) ×2 IMPLANT
TROCAR XCEL NON-BLD 5MMX100MML (ENDOMECHANICALS) ×2 IMPLANT
TUBING INSUFFLATION (TUBING) ×2 IMPLANT

## 2017-02-15 NOTE — Progress Notes (Signed)
Patient discharge teaching given, including activity, diet, follow-up appoints, and medications. Patient verbalized understanding of all discharge instructions. IV access was d/c'd. Vitals are stable. Skin is intact except as charted in most recent assessments. Pt to be escorted out by NT, to be driven home by family.  Sydnie Sigmund  

## 2017-02-15 NOTE — Anesthesia Post-op Follow-up Note (Signed)
Anesthesia QCDR form completed.        

## 2017-02-15 NOTE — Discharge Instructions (Signed)
In addition to included general post-operative instructions for Laparoscopic Appendectomy,  Diet: Resume home heart healthy diet.   Activity: No heavy lifting >20 pounds (children, pets, laundry, garbage) or strenuous activity until follow-up, but light activity and walking are encouraged. Do not drive or drink alcohol if taking narcotic pain medications.  Wound care: 2 days after surgery (Thursday, 9/20), may shower/get incision wet with soapy water and pat dry (do not rub incisions), but no baths or submerging incision underwater until follow-up.   Medications: Resume all home medications. For mild to moderate pain: acetaminophen (Tylenol) or ibuprofen (if no kidney disease). Combining Tylenol with alcohol can substantially increase your risk of causing liver disease. Narcotic pain medications, if prescribed, can be used for severe pain, though may cause nausea, constipation, and drowsiness. Do not combine Tylenol and Percocet within a 6 hour period as Percocet contains Tylenol. If you do not need the narcotic pain medication, you do not need to fill the prescription.  Call office [phone]) at any time if any questions, worsening pain, fevers/chills, bleeding, drainage from incision site, or other concerns.

## 2017-02-15 NOTE — Op Note (Signed)
SURGICAL OPERATIVE REPORT  DATE OF PROCEDURE: 02/15/2017  ATTENDING Surgeon(s): Ancil Linsey, MD  ANESTHESIA: GETA (General)  PRE-OPERATIVE DIAGNOSIS: Acute non-perforated appendicitis with localized peritonitis (K35.3)  POST-OPERATIVE DIAGNOSIS: Acute non-perforated appendicitis with localized peritonitis (K35.3)  PROCEDURE(S):  1.) Laparoscopic appendectomy (cpt: 44970)  INTRAOPERATIVE FINDINGS: Moderately inflamed and dilated non-perforated appendix adherent to patient's anterior abdominal wall and surrounded by a small amount of murky ascites  INTRAVENOUS FLUIDS: 200 mL crystalloid   ESTIMATED BLOOD LOSS: Minimal (<20 mL)  URINE OUTPUT: 50 mL   SPECIMENS: Appendix  IMPLANTS: None  DRAINS: None  COMPLICATIONS: None apparent  CONDITION AT END OF PROCEDURE: Hemodynamically stable and extubated  DISPOSITION OF PATIENT: PACU  INDICATIONS FOR PROCEDURE:  Patient is a 21 y.o. otherwise reportedly healthy moderately overweight female who presented with acute onset of epigastric abdominal pain that worsened and progressed to become greatest at the Right lower quadrant. Patient reported her pain was well-controlled in the Emergency Department. All risks, benefits, and alternatives to appendectomy were discussed with the patient, all of patient's questions were answered to her expressed satisfaction, and informed consent was obtained and documented.  DETAILS OF PROCEDURE: Patient was brought to the operating suite and appropriately identified. General anesthesia was administered along with confirmation of appropriate pre-operative antibiotics, and endotracheal intubation was performed by anesthetist, along with NG/OG tube for gastric decompression. In supine position, operative site was prepped and draped in usual sterile fashion, and following a brief time out, initial 5 mm incision was made in a natural skin crease just below the umbilicus. Fascia was then elevated, and a  Verress needle was inserted and its proper position confirmed using saline meniscus test prior to abdominal insufflation.  Upon insufflation of the abdominal cavity with carbon dioxide to a well-tolerated pressure of 12-15 mmHg, a 5 mm peri-umbilical port followed by laparoscope were inserted and used to inspect the abdominal cavity and its contents with no injuries from insertion of the first trochar noted. Two additional trocars were inserted, a 12 mm port at the Left lower quadrant position and another 5 mm port at the suprapubic position. The table was then placed in Trendelenburg position with the Right side up, and blunt graspers were gently used to retract the bowel overlying a clearly inflamed appendix adherent to the patient's anterior abdominal wall and surrounded inflamed loops of small intestine and by a small amount of murky ascites and moderate inflammation. The appendix was gently retracted by near its tip, and the base of the appendix and mesoappendix were identified in relation to the cecum. The mesoappendix was dissected from the visceral appendix and hemostasis achieved using a harmonic scalpel. Upon freeing the visceral appendix from the mesoappendix, an endostapler loaded with a standard blue tissue load was advanced across the base of the visceral appendix, which was compressed for several seconds, and the stapler was deployed and removed from the abdominal cavity. Hemostasis was confirmed, and the specimen was extracted from the abdominal cavity in a laparoscopic specimen bag.  The intraperitoneal cavity was inspected with no additional findings. PMI laparoscopic fascial closure device was then used to re-approximate fascia at the 12 mm Left lower quadrant port site. All ports were then removed under direct visualization, and the abdominal cavity was desuflated. All port sites were irrigated/cleaned, additional local anesthetic was injected at each incision, 3-0 Vicryl was used to  re-approximate dermis at 10/12 mm port site(s), and subcuticular 4-0 Monocryl suture was used to re-approximate skin. Skin was then cleaned, dried,  and sterile skin glue was applied. Patient was then safely able to be awakened, extubated, and transferred to PACU for post-operative monitoring and care.   I was present for all aspects of procedure, and there were no intra-operative complications apparent.

## 2017-02-15 NOTE — Transfer of Care (Signed)
Immediate Anesthesia Transfer of Care Note  Patient: Diane Beasley  Procedure(s) Performed: Procedure(s): APPENDECTOMY LAPAROSCOPIC (N/A)  Patient Location: PACU  Anesthesia Type:General  Level of Consciousness: sedated and responds to stimulation  Airway & Oxygen Therapy: Patient Spontanous Breathing and Patient connected to face mask oxygen  Post-op Assessment: Report given to RN and Post -op Vital signs reviewed and stable  Post vital signs: Reviewed and stable  Last Vitals:  Vitals:   02/15/17 0834 02/15/17 1029  BP: 107/66 117/71  Pulse: (!) 55 67  Resp: 16   Temp: (!) 36.3 C   SpO2: 100% 100%    Last Pain:  Vitals:   02/15/17 0834  TempSrc: Tympanic  PainSc: 8          Complications: No apparent anesthesia complications

## 2017-02-15 NOTE — Anesthesia Preprocedure Evaluation (Signed)
Anesthesia Evaluation  Patient identified by MRN, date of birth, ID band Patient awake    Reviewed: Allergy & Precautions, NPO status , Patient's Chart, lab work & pertinent test results  Airway Mallampati: II  TM Distance: >3 FB     Dental  (+) Chipped   Pulmonary neg pulmonary ROS,    Pulmonary exam normal        Cardiovascular negative cardio ROS Normal cardiovascular exam     Neuro/Psych negative neurological ROS  negative psych ROS   GI/Hepatic Neg liver ROS, Acute appendecitis   Endo/Other  negative endocrine ROS  Renal/GU negative Renal ROS  negative genitourinary   Musculoskeletal negative musculoskeletal ROS (+)   Abdominal Normal abdominal exam  (+)   Peds negative pediatric ROS (+)  Hematology negative hematology ROS (+)   Anesthesia Other Findings   Reproductive/Obstetrics                             Anesthesia Physical Anesthesia Plan  ASA: I and emergent  Anesthesia Plan: General   Post-op Pain Management:    Induction: Intravenous  PONV Risk Score and Plan: 4 or greater and Ondansetron, Dexamethasone, Midazolam, Scopolamine patch - Pre-op and Propofol infusion  Airway Management Planned: Oral ETT  Additional Equipment:   Intra-op Plan:   Post-operative Plan: Extubation in OR  Informed Consent: I have reviewed the patients History and Physical, chart, labs and discussed the procedure including the risks, benefits and alternatives for the proposed anesthesia with the patient or authorized representative who has indicated his/her understanding and acceptance.   Dental advisory given  Plan Discussed with: CRNA and Surgeon  Anesthesia Plan Comments:         Anesthesia Quick Evaluation

## 2017-02-15 NOTE — Anesthesia Procedure Notes (Signed)
Procedure Name: Intubation Performed by: Lance Muss Pre-anesthesia Checklist: Patient identified, Patient being monitored, Timeout performed, Emergency Drugs available and Suction available Patient Re-evaluated:Patient Re-evaluated prior to induction Oxygen Delivery Method: Circle system utilized Preoxygenation: Pre-oxygenation with 100% oxygen Induction Type: IV induction Ventilation: Mask ventilation without difficulty Laryngoscope Size: Mac and 3 Grade View: Grade I Tube type: Oral Tube size: 7.0 mm Number of attempts: 1 Airway Equipment and Method: Stylet Placement Confirmation: ETT inserted through vocal cords under direct vision,  positive ETCO2 and breath sounds checked- equal and bilateral Secured at: 22 cm Tube secured with: Tape Dental Injury: Teeth and Oropharynx as per pre-operative assessment

## 2017-02-15 NOTE — Interval H&P Note (Signed)
History and Physical Interval Note:  02/15/2017 8:43 AM  Diane Beasley  has presented today for surgery, with the diagnosis of Appendicitis  The various methods of treatment have been discussed with the patient and family. After consideration of risks, benefits and other options for treatment, the patient has consented to  Procedure(s): APPENDECTOMY LAPAROSCOPIC (N/A) as a surgical intervention .  The patient's history has been reviewed, patient examined, no change in status, stable for surgery.  I have reviewed the patient's chart and labs.  Questions were answered to the patient's satisfaction.     Ancil Linsey

## 2017-02-15 NOTE — Anesthesia Postprocedure Evaluation (Signed)
Anesthesia Post Note  Patient: Diane Beasley  Procedure(s) Performed: Procedure(s) (LRB): APPENDECTOMY LAPAROSCOPIC (N/A)  Patient location during evaluation: PACU Anesthesia Type: General Level of consciousness: awake and alert and oriented Pain management: pain level controlled Vital Signs Assessment: post-procedure vital signs reviewed and stable Respiratory status: spontaneous breathing Cardiovascular status: blood pressure returned to baseline Anesthetic complications: no     Last Vitals:  Vitals:   02/15/17 1114 02/15/17 1158  BP: 112/61 118/66  Pulse: (!) 59 92  Resp: 15   Temp:  36.8 C  SpO2: 100% 99%    Last Pain:  Vitals:   02/15/17 1158  TempSrc: Oral  PainSc:                  Konnar Ben

## 2017-02-16 LAB — NASOPHARYNGEAL CULTURE: CULTURE: NORMAL

## 2017-02-16 LAB — SURGICAL PATHOLOGY

## 2017-02-16 LAB — HIV ANTIBODY (ROUTINE TESTING W REFLEX): HIV SCREEN 4TH GENERATION: NONREACTIVE

## 2017-02-16 NOTE — Discharge Summary (Signed)
Physician Discharge Summary  Patient ID: Diane Beasley MRN: [id number] DOB/AGE: 02/14/1996 21 y.o.  Admit date: 02/14/2017 Discharge date: 02/16/2017  Admission Diagnoses:  Discharge Diagnoses:  Active Problems:   Acute appendicitis   Discharged Condition: good  Hospital Course: Patient is a 21 y.o. otherwise reportedly healthy moderately overweight female who presented to [facility] ED with acute onset of epigastric abdominal pain that worsened and progressed to become greatest at the Right lower quadrant. Workup was found to be significant for acute appendicitis, for which patient was admitted and treated with antibiotics and uneventful laparoscopic appendectomy. Post-operatively, the remainder of patient's course was unremarkable with patient's pain well-controlled and advancement of diet and ambulation well-tolerated. Accordingly, discharge planning was initiated, and patient was safely able to be discharged home with appropriate discharge instructions, pain control, and outpatient surgical follow-up after all of her questions were answered to her expressed satisfaction.  Consults: None  Significant Diagnostic Studies: radiology: CT scan: acute appendicitis  Treatments: IV hydration, antibiotics: ceftriaxone and metronidazole and surgery: laparoscopic appendectomy  Discharge Exam: Blood pressure (!) 109/58, pulse 74, temperature 97.8 F (36.6 C), temperature source Oral, resp. rate 15, height  (1.626 m), weight 178 lb 8 oz (81 kg), last menstrual period 01/12/2017, SpO2 100 %. General appearance: alert, cooperative, appears stated age and no distress GI: abdomen soft and non-distended with appropriate peri-incisional tenderness to palpation, incisions well-approximated without surrounding erythema or drainage  Disposition: 01-Home or Self Care   Allergies as of 02/15/2017   No Known Allergies     Medication List    TAKE these medications   oxyCODONE-acetaminophen  5-325 MG tablet Commonly known as:  PERCOCET/ROXICET Take 1-2 tablets by mouth every 4 (four) hours as needed for moderate pain.            Discharge Care Instructions        Start     Ordered   02/15/17 0000  oxyCODONE-acetaminophen (PERCOCET/ROXICET) 5-325 MG tablet  Every 4 hours PRN     02/15/17 1638     Follow-up Information    [facility]. Schedule an appointment as soon as possible for a visit in 2 week(s).   Specialty:  General Surgery Contact information: [address] [place] [address] [phone]          Signed: Ancil Linsey 02/16/2017, 10:08 AM

## 2017-02-25 ENCOUNTER — Telehealth: Payer: Self-pay

## 2017-02-25 NOTE — Telephone Encounter (Signed)
Patient called at this time and stated that believes she is developing a rash around her incision site. She said that it is not bright red but it does have a pinkish tint to it. She stated that she doesn't have and fever or chills. Denies nausea vomiting, constipation, and diarrhea. She did say the area has some small red bumps kind of like hives that she has noticed. I advised her to try some over the counter bendryal over the weekend. I also offered her appointment to come in she declined and stated that she will try the bendryal. I told her to give our office a call on Monday if that was not working for her. She also stated that she needed to make a post op appointment but due to not having insurance was worried about the cost. I advised her that she would not have a cost seeing that its a post op visit. She was enlighten and scheduled her post op appointment for  10/11 with Dr. Earlene Plater.

## 2017-03-10 ENCOUNTER — Ambulatory Visit (INDEPENDENT_AMBULATORY_CARE_PROVIDER_SITE_OTHER): Payer: Commercial Managed Care - PPO | Admitting: Surgery

## 2017-03-10 ENCOUNTER — Encounter: Payer: Self-pay | Admitting: Surgery

## 2017-03-10 VITALS — BP 95/57 | HR 76 | Temp 98.0°F | Ht 64.0 in | Wt 187.2 lb

## 2017-03-10 DIAGNOSIS — K353 Acute appendicitis with localized peritonitis, without perforation or gangrene: Secondary | ICD-10-CM

## 2017-03-10 NOTE — Patient Instructions (Signed)
You can now resume normal activities. Listen to your body. If you do something and it hurts then stop and try again in a few days.  Please call our office if you have questions or concerns.

## 2017-03-10 NOTE — Progress Notes (Signed)
Surgical Clinic Progress/Follow-up Note   HPI:  21 y.o. Female presents to clinic for post-op follow-up evaluation 2 weeks s/p laparoscopic appendectomy. Patient reports resolution of her pain after 3 days and has not since required or used any narcotic pain medication. She otherwise reports +flatus and BM WNL, denies any N/V, fever/chills, CP, or SOB.  Review of Systems:  Constitutional: denies any other weight loss, fever, chills, or sweats  Eyes: denies any other vision changes, history of eye injury  ENT: denies sore throat, hearing problems  Respiratory: denies shortness of breath, wheezing  Cardiovascular: denies chest pain, palpitations  Gastrointestinal: abdominal pain, N/V, and bowel function as per HPI Musculoskeletal: denies any other joint pains or cramps  Skin: Denies any other rashes or skin discolorations  Neurological: denies any other headache, dizziness, weakness  Psychiatric: denies any other depression, anxiety  All other review of systems: otherwise negative   Vital Signs:  BP (!) 95/57   Pulse 76   Temp 98 F (36.7 C) (Oral)   Ht  (1.626 m)   Wt 187 lb 3.2 oz (84.9 kg)   BMI 32.13 kg/m    Physical Exam:  Constitutional:  -- Normal overweight body habitus  -- Awake, alert, and oriented x3  Eyes:  -- Pupils equally round and reactive to light  -- No scleral icterus  Ear, nose, throat:  -- No jugular venous distension  -- No nasal drainage, bleeding Pulmonary:  -- No crackles -- Equal breath sounds bilaterally -- Breathing non-labored at rest Cardiovascular:  -- S1, S2 present  -- No pericardial rubs  Gastrointestinal:  -- Soft, nontender, non-distended, no guarding/rebound  -- Incisions well-approximated without erythema or drainage -- No abdominal masses appreciated, pulsatile or otherwise  Musculoskeletal / Integumentary:  -- Wounds or skin discoloration: None appreciated  -- Extremities: B/L UE and LE FROM, hands and feet warm, no  edema  Neurologic:  -- Motor function: intact and symmetric  -- Sensation: intact and symmetric   Assessment:  21 y.o. yo Female with a problem list including...  Patient Active Problem List   Diagnosis Date Noted  . Acute appendicitis with localized peritonitis 02/14/2017  . Chlamydia 11/13/2014    presents to clinic for post-op follow-up evaluation, doing well s/p laparoscopic appendectomy for acute appendicitis.  Plan:   - gradually resume activities without restrictions   - okay to shower or submerge incisions under water (baths)  - instructed to call office if any questions or concerns  - return to clinic as needed  All of the above recommendations were discussed with the patient, and all of patient's questions were answered to her expressed satisfaction.  -- Scherrie Gerlach Earlene Plater, MD, RPVI : [facility] General Surgery - Partnering for exceptional care. Office: [phone]

## 2017-11-22 ENCOUNTER — Other Ambulatory Visit: Payer: Self-pay

## 2017-11-22 ENCOUNTER — Encounter: Payer: Self-pay | Admitting: Emergency Medicine

## 2017-11-22 ENCOUNTER — Ambulatory Visit
Admission: EM | Admit: 2017-11-22 | Discharge: 2017-11-22 | Disposition: A | Discharge status: Home / Self Care | Payer: Commercial Managed Care - PPO | Attending: Family Medicine | Admitting: Family Medicine

## 2017-11-22 DIAGNOSIS — B9689 Other specified bacterial agents as the cause of diseases classified elsewhere: Secondary | ICD-10-CM

## 2017-11-22 DIAGNOSIS — R300 Dysuria: Secondary | ICD-10-CM

## 2017-11-22 DIAGNOSIS — N760 Acute vaginitis: Secondary | ICD-10-CM

## 2017-11-22 DIAGNOSIS — R350 Frequency of micturition: Secondary | ICD-10-CM

## 2017-11-22 LAB — URINALYSIS, COMPLETE (UACMP) WITH MICROSCOPIC
BACTERIA UA: NONE SEEN
Bilirubin Urine: NEGATIVE
Glucose, UA: NEGATIVE mg/dL
Hgb urine dipstick: NEGATIVE
Ketones, ur: NEGATIVE mg/dL
Leukocytes, UA: NEGATIVE
Nitrite: POSITIVE — AB
PH: 6.5 (ref 5.0–8.0)
Protein, ur: NEGATIVE mg/dL
SPECIFIC GRAVITY, URINE: 1.015 (ref 1.005–1.030)

## 2017-11-22 LAB — WET PREP, GENITAL
Sperm: NONE SEEN
Trich, Wet Prep: NONE SEEN
Yeast Wet Prep HPF POC: NONE SEEN

## 2017-11-22 LAB — CHLAMYDIA/NGC RT PCR (ARMC ONLY)
Chlamydia Tr: NOT DETECTED
N GONORRHOEAE: NOT DETECTED

## 2017-11-22 MED ORDER — CEPHALEXIN 500 MG PO CAPS: 500.0000 mg | ORAL_CAPSULE | Freq: Two times a day (BID) | ORAL | 0 refills | Status: DC

## 2017-11-22 MED ORDER — METRONIDAZOLE 500 MG PO TABS: 500.0000 mg | ORAL_TABLET | Freq: Two times a day (BID) | ORAL | 0 refills | Status: DC

## 2017-11-22 NOTE — Progress Notes (Signed)
Patient called. relayed to pt per dr. Adriana Simascook

## 2017-11-22 NOTE — Discharge Instructions (Addendum)
We will call with remainder of the results.  Flagyl as prescribed.  Take care  Dr. Adriana Simasook

## 2017-11-22 NOTE — ED Provider Notes (Signed)
[facility]    CSN: [id number] Arrival date & time: 11/22/17  1235  History   Chief Complaint Chief Complaint  Patient presents with  . Dysuria   HPI  22 year old female presents with dysuria.  Patient reports a 2-week history of dysuria.  She states that it burns when she urinates.  She reports some urinary frequency.  No vaginal discharge.  No vaginal itching.  Patient states that she has tried Monistat without improvement.  When asked about sexual activity, she states that she is sexually active.  Patient is slightly concerned about STD.  No abdominal pain.  No fever.  No exacerbating or relieving factors.  No other complaints at this time.  Past Medical History:  Diagnosis Date  . Acute appendicitis   . Chlamydia    Patient Active Problem List   Diagnosis Date Noted  . Acute appendicitis with localized peritonitis 02/14/2017  . Chlamydia 11/13/2014   Past Surgical History:  Procedure Laterality Date  . LAPAROSCOPIC APPENDECTOMY N/A 02/15/2017   Procedure: APPENDECTOMY LAPAROSCOPIC;  Surgeon: Ancil Linsey, MD;  Location: [facility] ORS;  Service: General;  Laterality: N/A;   OB History   None    Home Medications    Prior to Admission medications   Medication Sig Start Date End Date Taking? Authorizing Provider  metroNIDAZOLE (FLAGYL) 500 MG tablet Take 1 tablet (500 mg total) by mouth 2 (two) times daily. 11/22/17   Tommie Sams, DO   Family History Family History  Problem Relation Age of Onset  . Diabetes Maternal Grandmother    Social History Social History   Tobacco Use  . Smoking status: Never Smoker  . Smokeless tobacco: Never Used  Substance Use Topics  . Alcohol use: No  . Drug use: No   Allergies   Patient has no known allergies.   Review of Systems Review of Systems  Constitutional: Negative.   Genitourinary: Positive for dysuria.   Physical Exam Triage Vital Signs ED Triage Vitals  Enc Vitals Group     BP 11/22/17 1256  126/76     Pulse Rate 11/22/17 1256 68     Resp 11/22/17 1256 14     Temp 11/22/17 1256 98.4 F (36.9 C)     Temp Source 11/22/17 1256 Oral     SpO2 11/22/17 1256 100 %     Weight 11/22/17 1253 180 lb (81.6 kg)     Height 11/22/17 1253 5\' 4"  (1.626 m)     Head Circumference --      Peak Flow --      Pain Score 11/22/17 1253 0     Pain Loc --      Pain Edu? --      Excl. in GC? --    Updated Vital Signs BP 126/76 (BP Location: Left Arm)   Pulse 68   Temp 98.4 F (36.9 C) (Oral)   Resp 14   Ht 5\' 4"  (1.626 m)   Wt 180 lb (81.6 kg)   LMP 11/12/2017 (Exact Date)   SpO2 100%   BMI 30.90 kg/m   Physical Exam  Constitutional: She is oriented to person, place, and time. She appears well-developed. No distress.  Cardiovascular: Normal rate and regular rhythm.  Pulmonary/Chest: Effort normal and breath sounds normal. She has no wheezes. She has no rales.  Abdominal: Soft. She exhibits no distension. There is no tenderness.  Genitourinary:  Genitourinary Comments: Pelvic Exam: External: normal female genitalia without lesions or masses Vagina: clear to white  discharge. Cervix: normal without lesions or masses. Samples for Wet prep, GC/Chlamydia obtained   Neurological: She is alert and oriented to person, place, and time.  Psychiatric: She has a normal mood and affect. Her behavior is normal.  Nursing note and vitals reviewed.  UC Treatments / Results  Labs (all labs ordered are listed, but only abnormal results are displayed) Labs Reviewed  WET PREP, GENITAL - Abnormal; Notable for the following components:      Result Value   Clue Cells Wet Prep HPF POC PRESENT (*)    WBC, Wet Prep HPF POC FEW (*)    All other components within normal limits  URINALYSIS, COMPLETE (UACMP) WITH MICROSCOPIC - Abnormal; Notable for the following components:   Nitrite POSITIVE (*)    All other components within normal limits  URINE CULTURE  CHLAMYDIA/NGC RT PCR ([facility] ONLY)     EKG None  Radiology No results found.  Procedures Procedures (including critical care time)  Medications Ordered in UC Medications - No data to display  Initial Impression / Assessment and Plan / UC Course  I have reviewed the triage vital signs and the nursing notes.  Pertinent labs & imaging results that were available during my care of the patient were reviewed by me and considered in my medical decision making (see chart for details).    22 year old female presents with dysuria.  Clinically I do not suspect UTI.  Wet prep was done and revealed clue cells consistent with bacterial vaginosis.  Awaiting urine culture and STD testing.  Treating with Flagyl.  Final Clinical Impressions(s) / UC Diagnoses   Final diagnoses:  Dysuria  Bacterial vaginosis     Discharge Instructions     We will call with remainder of the results.  Flagyl as prescribed.  Take care  Dr. Adriana Simasook    ED Prescriptions    Medication Sig Dispense Auth. Provider   cephALEXin (KEFLEX) 500 MG capsule  (Status: Discontinued) Take 1 capsule (500 mg total) by mouth 2 (two) times daily. 10 capsule Caira Poche G, DO   metroNIDAZOLE (FLAGYL) 500 MG tablet Take 1 tablet (500 mg total) by mouth 2 (two) times daily. 14 tablet Tommie Samsook, Reiley Keisler G, DO     Controlled Substance Prescriptions [place] Controlled Substance Registry consulted? Not Applicable   Tommie SamsCook, Khaidyn Staebell G, DO 11/22/17 1356

## 2017-11-22 NOTE — ED Triage Notes (Signed)
Patient c/o burning when urinating for the past 2 weeks.

## 2017-11-25 ENCOUNTER — Telehealth (HOSPITAL_COMMUNITY): Payer: Self-pay

## 2017-11-25 LAB — URINE CULTURE: Culture: 60000 — AB

## 2017-11-25 NOTE — Telephone Encounter (Signed)
Urine culture positive for Proteus Mirabilis. This was not treated at urgent care visit. Keflex 500 mg bid x 5 days sent to pharmacy of record. Attempted to reach patient, no answer and no voicemail available.

## 2017-12-07 ENCOUNTER — Telehealth: Payer: Self-pay | Admitting: Emergency Medicine

## 2017-12-07 NOTE — Telephone Encounter (Signed)
Patient called in requesting results of her urine culture. Notified patient that a phone call was placed to her on 06/28 and they were unable to leave a message.  Notified patient that Urine culture was positive for Proteus Mirabilis. This was not treated at urgent care visit. Keflex 500 mg bid x 5 days sent to pharmacy of record. Confirmed pharmacy with patient. She stated she will pick up the prescription. Patient verbalized understanding and had no further questions or concerns.

## 2017-12-07 NOTE — Telephone Encounter (Signed)
Patient called in stating that prescription for Keflex 500mg  bid x 5 days was not at pharmacy. Called pharmacy on file and verified that they did not have a prescription on file. Gave verbal prescription of Keflex 500 mg 1 po bid x 5 days. Patient notified that prescription has been called in. Patient agreed and voiced understanding.

## 2018-07-14 ENCOUNTER — Other Ambulatory Visit: Payer: Self-pay

## 2018-07-14 ENCOUNTER — Ambulatory Visit
Admission: EM | Admit: 2018-07-14 | Discharge: 2018-07-14 | Disposition: A | Discharge status: Home / Self Care | Payer: Self-pay | Attending: Family Medicine | Admitting: Family Medicine

## 2018-07-14 ENCOUNTER — Encounter: Payer: Self-pay | Admitting: Emergency Medicine

## 2018-07-14 DIAGNOSIS — J101 Influenza due to other identified influenza virus with other respiratory manifestations: Secondary | ICD-10-CM

## 2018-07-14 LAB — RAPID INFLUENZA A&B ANTIGENS (ARMC ONLY): INFLUENZA B (ARMC): POSITIVE — AB

## 2018-07-14 LAB — RAPID STREP SCREEN (MED CTR MEBANE ONLY): STREPTOCOCCUS, GROUP A SCREEN (DIRECT): NEGATIVE

## 2018-07-14 LAB — RAPID INFLUENZA A&B ANTIGENS: Influenza A (ARMC): NEGATIVE

## 2018-07-14 MED ORDER — OSELTAMIVIR PHOSPHATE 75 MG PO CAPS: 75.0000 mg | ORAL_CAPSULE | Freq: Two times a day (BID) | ORAL | 0 refills | Status: DC

## 2018-07-14 MED ORDER — ACETAMINOPHEN 500 MG PO TABS
1000.0000 mg | ORAL_TABLET | Freq: Once | ORAL | Status: AC
  Administered 2018-07-14: 1000 mg via ORAL

## 2018-07-14 NOTE — ED Provider Notes (Signed)
[facility]    CSN: [id number] Arrival date & time: 07/14/18  1241     History   Chief Complaint Chief Complaint  Patient presents with  . Cough    APPT  . Sore Throat  . Generalized Body Aches    HPI Diane Beasley is a 23 y.o. female.   The history is provided by the patient.  Cough  Associated symptoms: fever, myalgias and sore throat   Sore Throat   URI  Presenting symptoms: cough, fatigue, fever and sore throat   Severity:  Moderate Onset quality:  Sudden Duration:  2 days Timing:  Constant Progression:  Worsening Chronicity:  New Relieved by:  None tried Ineffective treatments:  None tried Associated symptoms: myalgias   Risk factors: sick contacts     Past Medical History:  Diagnosis Date  . Acute appendicitis   . Chlamydia     Patient Active Problem List   Diagnosis Date Noted  . Acute appendicitis with localized peritonitis 02/14/2017  . Chlamydia 11/13/2014    Past Surgical History:  Procedure Laterality Date  . LAPAROSCOPIC APPENDECTOMY N/A 02/15/2017   Procedure: APPENDECTOMY LAPAROSCOPIC;  Surgeon: Ancil Linsey, MD;  Location: [facility] ORS;  Service: General;  Laterality: N/A;    OB History   No obstetric history on file.      Home Medications    Prior to Admission medications   Medication Sig Start Date End Date Taking? Authorizing Provider  metroNIDAZOLE (FLAGYL) 500 MG tablet Take 1 tablet (500 mg total) by mouth 2 (two) times daily. 11/22/17   Tommie Sams, DO  oseltamivir (TAMIFLU) 75 MG capsule Take 1 capsule (75 mg total) by mouth 2 (two) times daily. 07/14/18   Payton Mccallum, MD    Family History Family History  Problem Relation Age of Onset  . Diabetes Maternal Grandmother     Social History Social History   Tobacco Use  . Smoking status: Never Smoker  . Smokeless tobacco: Never Used  Substance Use Topics  . Alcohol use: No  . Drug use: No     Allergies   Patient has no known  allergies.   Review of Systems Review of Systems  Constitutional: Positive for fatigue and fever.  HENT: Positive for sore throat.   Respiratory: Positive for cough.   Musculoskeletal: Positive for myalgias.     Physical Exam Triage Vital Signs ED Triage Vitals  Enc Vitals Group     BP 07/14/18 1252 (!) 124/96     Pulse Rate 07/14/18 1252 (!) 105     Resp 07/14/18 1252 16     Temp 07/14/18 1252 (!) 102.2 F (39 C)     Temp Source 07/14/18 1252 Oral     SpO2 07/14/18 1252 100 %     Weight 07/14/18 1249 180 lb (81.6 kg)     Height 07/14/18 1249 5\' 4"  (1.626 m)     Head Circumference --      Peak Flow --      Pain Score 07/14/18 1249 8     Pain Loc --      Pain Edu? --      Excl. in GC? --    No data found.  Updated Vital Signs BP (!) 124/96 (BP Location: Left Arm)   Pulse (!) 105   Temp (!) 102.2 F (39 C) (Oral)   Resp 16   Ht 5\' 4"  (1.626 m)   Wt 81.6 kg   LMP 06/27/2018 (Approximate)  SpO2 100%   BMI 30.90 kg/m   Visual Acuity Right Eye Distance:   Left Eye Distance:   Bilateral Distance:    Right Eye Near:   Left Eye Near:    Bilateral Near:     Physical Exam Vitals signs and nursing note reviewed.  Constitutional:      General: She is not in acute distress.    Appearance: She is well-developed. She is not toxic-appearing or diaphoretic.  HENT:     Head: Normocephalic and atraumatic.     Right Ear: Tympanic membrane, ear canal and external ear normal.     Left Ear: Tympanic membrane, ear canal and external ear normal.     Nose: Nose normal.     Mouth/Throat:     Pharynx: Uvula midline. No oropharyngeal exudate.  Neck:     Musculoskeletal: Normal range of motion and neck supple.     Thyroid: No thyromegaly.  Cardiovascular:     Rate and Rhythm: Normal rate and regular rhythm.     Heart sounds: Normal heart sounds.  Pulmonary:     Effort: Pulmonary effort is normal. No respiratory distress.     Breath sounds: Normal breath sounds. No  stridor. No wheezing, rhonchi or rales.  Lymphadenopathy:     Cervical: No cervical adenopathy.  Neurological:     Mental Status: She is alert.      UC Treatments / Results  Labs (all labs ordered are listed, but only abnormal results are displayed) Labs Reviewed  RAPID INFLUENZA A&B ANTIGENS ([facility] ONLY) - Abnormal; Notable for the following components:      Result Value   Influenza B ([facility]) POSITIVE (*)    All other components within normal limits  RAPID STREP SCREEN ([facility] ONLY)  CULTURE, GROUP A STREP [facility])    EKG None  Radiology No results found.  Procedures Procedures (including critical care time)  Medications Ordered in UC Medications  acetaminophen (TYLENOL) tablet 1,000 mg (1,000 mg Oral Given 07/14/18 1255)    Initial Impression / Assessment and Plan / UC Course  I have reviewed the triage vital signs and the nursing notes.  Pertinent labs & imaging results that were available during my care of the patient were reviewed by me and considered in my medical decision making (see chart for details).      Final Clinical Impressions(s) / UC Diagnoses   Final diagnoses:  Influenza B    ED Prescriptions    Medication Sig Dispense Auth. Provider   oseltamivir (TAMIFLU) 75 MG capsule Take 1 capsule (75 mg total) by mouth 2 (two) times daily. 10 capsule Payton Mccallum, MD     1. Lab results and diagnosis reviewed with patient 2. rx as per orders above; reviewed possible side effects, interactions, risks and benefits  3. Recommend supportive treatment with rest, fluids, otc analgesics prn 4. Follow-up prn if symptoms worsen or don't improve  Controlled Substance Prescriptions [place] Controlled Substance Registry consulted? Not Applicable   Payton Mccallum, MD 07/14/18 1359

## 2018-07-14 NOTE — ED Triage Notes (Signed)
Patient c/o sore throat, cough, and bodyaches that started 2 days ago.

## 2018-07-17 LAB — CULTURE, GROUP A STREP (THRC)

## 2019-08-21 ENCOUNTER — Emergency Department
Admission: EM | Admit: 2019-08-21 | Discharge: 2019-08-21 | Disposition: A | Discharge status: Home / Self Care | Payer: 59 | Attending: Emergency Medicine | Admitting: Emergency Medicine

## 2019-08-21 ENCOUNTER — Other Ambulatory Visit: Payer: Self-pay

## 2019-08-21 ENCOUNTER — Emergency Department: Payer: 59

## 2019-08-21 DIAGNOSIS — S8011XA Contusion of right lower leg, initial encounter: Secondary | ICD-10-CM | POA: Insufficient documentation

## 2019-08-21 DIAGNOSIS — Z23 Encounter for immunization: Secondary | ICD-10-CM | POA: Diagnosis not present

## 2019-08-21 DIAGNOSIS — S5012XA Contusion of left forearm, initial encounter: Secondary | ICD-10-CM | POA: Diagnosis not present

## 2019-08-21 DIAGNOSIS — T07XXXA Unspecified multiple injuries, initial encounter: Secondary | ICD-10-CM

## 2019-08-21 DIAGNOSIS — S20212A Contusion of left front wall of thorax, initial encounter: Secondary | ICD-10-CM | POA: Diagnosis not present

## 2019-08-21 DIAGNOSIS — S8012XA Contusion of left lower leg, initial encounter: Secondary | ICD-10-CM | POA: Insufficient documentation

## 2019-08-21 DIAGNOSIS — Y939 Activity, unspecified: Secondary | ICD-10-CM | POA: Diagnosis not present

## 2019-08-21 DIAGNOSIS — Y999 Unspecified external cause status: Secondary | ICD-10-CM | POA: Diagnosis not present

## 2019-08-21 DIAGNOSIS — S199XXA Unspecified injury of neck, initial encounter: Secondary | ICD-10-CM | POA: Diagnosis present

## 2019-08-21 DIAGNOSIS — S161XXA Strain of muscle, fascia and tendon at neck level, initial encounter: Secondary | ICD-10-CM | POA: Diagnosis not present

## 2019-08-21 DIAGNOSIS — Y92410 Unspecified street and highway as the place of occurrence of the external cause: Secondary | ICD-10-CM | POA: Diagnosis not present

## 2019-08-21 LAB — URINALYSIS, COMPLETE (UACMP) WITH MICROSCOPIC
Bacteria, UA: NONE SEEN
Bilirubin Urine: NEGATIVE
Glucose, UA: NEGATIVE mg/dL
Hgb urine dipstick: NEGATIVE
Ketones, ur: NEGATIVE mg/dL
Leukocytes,Ua: NEGATIVE
Nitrite: NEGATIVE
Protein, ur: NEGATIVE mg/dL
Specific Gravity, Urine: 1.026 (ref 1.005–1.030)
Squamous Epithelial / LPF: NONE SEEN (ref 0–5)
pH: 5 (ref 5.0–8.0)

## 2019-08-21 MED ORDER — CYCLOBENZAPRINE HCL 10 MG PO TABS: 10.0000 mg | ORAL_TABLET | Freq: Three times a day (TID) | ORAL | 0 refills | Status: DC | PRN

## 2019-08-21 MED ORDER — OXYCODONE-ACETAMINOPHEN 5-325 MG PO TABS: 1.0000 | ORAL_TABLET | ORAL | 0 refills | Status: DC | PRN

## 2019-08-21 MED ORDER — OXYCODONE-ACETAMINOPHEN 5-325 MG PO TABS
1.0000 | ORAL_TABLET | Freq: Once | ORAL | Status: AC
  Administered 2019-08-21: 22:00:00 1 via ORAL
  Filled 2019-08-21: qty 1

## 2019-08-21 MED ORDER — MELOXICAM 15 MG PO TABS: 15.0000 mg | ORAL_TABLET | Freq: Every day | ORAL | 2 refills | Status: DC

## 2019-08-21 MED ORDER — CYCLOBENZAPRINE HCL 10 MG PO TABS
10.0000 mg | ORAL_TABLET | Freq: Once | ORAL | Status: AC
  Administered 2019-08-21: 10 mg via ORAL
  Filled 2019-08-21: qty 1

## 2019-08-21 MED ORDER — TETANUS-DIPHTH-ACELL PERTUSSIS 5-2.5-18.5 LF-MCG/0.5 IM SUSP
0.5000 mL | Freq: Once | INTRAMUSCULAR | Status: AC
  Administered 2019-08-21: 21:00:00 0.5 mL via INTRAMUSCULAR
  Filled 2019-08-21: qty 0.5

## 2019-08-21 NOTE — Discharge Instructions (Signed)
Follow-up with your regular doctor if not improving in 2 to 3 days. Return emergency department if worsening side pain or if you see blood in your urine Take your medication as prescribed If you feel that your neck and lower extremities are still hurting in 1 week you should follow-up with orthopedics.  Noted clinic orthopedics phone number is listed on your papers.
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
## 2019-09-03 ENCOUNTER — Other Ambulatory Visit: Payer: Self-pay

## 2019-09-03 ENCOUNTER — Encounter: Payer: Self-pay | Admitting: Emergency Medicine

## 2019-09-03 ENCOUNTER — Ambulatory Visit
Admission: EM | Admit: 2019-09-03 | Discharge: 2019-09-03 | Disposition: A | Discharge status: Home / Self Care | Payer: 59 | Attending: Urgent Care | Admitting: Urgent Care

## 2019-09-03 ENCOUNTER — Telehealth: Payer: 59 | Admitting: Family

## 2019-09-03 DIAGNOSIS — M79662 Pain in left lower leg: Secondary | ICD-10-CM

## 2019-09-03 DIAGNOSIS — M545 Low back pain, unspecified: Secondary | ICD-10-CM

## 2019-09-03 DIAGNOSIS — M79661 Pain in right lower leg: Secondary | ICD-10-CM

## 2019-09-03 DIAGNOSIS — S8012XA Contusion of left lower leg, initial encounter: Secondary | ICD-10-CM

## 2019-09-03 DIAGNOSIS — M79632 Pain in left forearm: Secondary | ICD-10-CM

## 2019-09-03 DIAGNOSIS — M5442 Lumbago with sciatica, left side: Secondary | ICD-10-CM

## 2019-09-03 DIAGNOSIS — S5012XA Contusion of left forearm, initial encounter: Secondary | ICD-10-CM

## 2019-09-03 DIAGNOSIS — S8011XA Contusion of right lower leg, initial encounter: Secondary | ICD-10-CM

## 2019-09-03 MED ORDER — TIZANIDINE HCL 4 MG PO TABS: 4.0000 mg | ORAL_TABLET | Freq: Four times a day (QID) | ORAL | 0 refills | Status: DC | PRN

## 2019-09-03 MED ORDER — NAPROXEN 500 MG PO TABS: 500.0000 mg | ORAL_TABLET | Freq: Two times a day (BID) | ORAL | 0 refills | Status: DC
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
## 2021-03-06 ENCOUNTER — Other Ambulatory Visit: Payer: Self-pay

## 2021-03-06 ENCOUNTER — Emergency Department
Admission: EM | Admit: 2021-03-06 | Discharge: 2021-03-07 | Disposition: A | Discharge status: Home / Self Care | Payer: Self-pay | Attending: Emergency Medicine | Admitting: Emergency Medicine

## 2021-03-06 ENCOUNTER — Encounter: Payer: Self-pay | Admitting: Emergency Medicine

## 2021-03-06 DIAGNOSIS — S92352A Displaced fracture of fifth metatarsal bone, left foot, initial encounter for closed fracture: Secondary | ICD-10-CM | POA: Insufficient documentation

## 2021-03-06 DIAGNOSIS — F10920 Alcohol use, unspecified with intoxication, uncomplicated: Secondary | ICD-10-CM

## 2021-03-06 DIAGNOSIS — F10120 Alcohol abuse with intoxication, uncomplicated: Secondary | ICD-10-CM | POA: Insufficient documentation

## 2021-03-06 DIAGNOSIS — X58XXXA Exposure to other specified factors, initial encounter: Secondary | ICD-10-CM | POA: Insufficient documentation

## 2021-03-06 LAB — CBC WITH DIFFERENTIAL/PLATELET
Abs Immature Granulocytes: 0.02 10*3/uL (ref 0.00–0.07)
Basophils Absolute: 0 10*3/uL (ref 0.0–0.1)
Basophils Relative: 0 %
Eosinophils Absolute: 0.1 10*3/uL (ref 0.0–0.5)
Eosinophils Relative: 1 %
HCT: 39.7 % (ref 36.0–46.0)
Hemoglobin: 13.8 g/dL (ref 12.0–15.0)
Immature Granulocytes: 0 %
Lymphocytes Relative: 28 %
Lymphs Abs: 2.3 10*3/uL (ref 0.7–4.0)
MCH: 31.9 pg (ref 26.0–34.0)
MCHC: 34.8 g/dL (ref 30.0–36.0)
MCV: 91.9 fL (ref 80.0–100.0)
Monocytes Absolute: 0.5 10*3/uL (ref 0.1–1.0)
Monocytes Relative: 6 %
Neutro Abs: 5.4 10*3/uL (ref 1.7–7.7)
Neutrophils Relative %: 65 %
Platelets: 249 10*3/uL (ref 150–400)
RBC: 4.32 MIL/uL (ref 3.87–5.11)
RDW: 12.4 % (ref 11.5–15.5)
WBC: 8.3 10*3/uL (ref 4.0–10.5)
nRBC: 0 % (ref 0.0–0.2)

## 2021-03-06 LAB — BASIC METABOLIC PANEL
Anion gap: 12 (ref 5–15)
BUN: 8 mg/dL (ref 6–20)
CO2: 20 mmol/L — ABNORMAL LOW (ref 22–32)
Calcium: 8.6 mg/dL — ABNORMAL LOW (ref 8.9–10.3)
Chloride: 106 mmol/L (ref 98–111)
Creatinine, Ser: 0.64 mg/dL (ref 0.44–1.00)
GFR, Estimated: >60 mL/min (ref >60)
Glucose, Bld: 117 mg/dL — ABNORMAL HIGH (ref 70–99)
Potassium: 3.3 mmol/L — ABNORMAL LOW (ref 3.5–5.1)
Sodium: 138 mmol/L (ref 135–145)

## 2021-03-06 NOTE — ED Provider Notes (Addendum)
-----------------------------------------   11:05 PM on 03/06/2021 -----------------------------------------  Assuming care from Dr. Derrill Kay.  In short, Diane Beasley is a 25 y.o. female with a chief complaint of alcohol intoxication.  Refer to the original H&P for additional details.  The current plan of care is to monitor while she metabolizes the alcohol.    ----------------------------------------- 1:31 AM on 03/07/2021 -----------------------------------------  Patient is awake and alert, clinically sober although obviously not feeling well.  I ordered Zofran 4 mg ODT.  She is able to walk but once she was awake and alert she was able to tell me that she had a fall about 2 weeks ago and has had persistent pain in her left foot and difficulty bearing weight which is worse tonight.  Unclear if she had an additional injury or not tonight while intoxicated.  I ordered x-rays and personally reviewed them as well as the radiologist report, and confirmed that she has a fracture involving the left fifth metatarsal with minimal displacement.  I have dated the patient, provided a hard soled shoe and crutches, and provided her information about how to follow-up at the [facility] with Dr. Lilian Kapur or one of his colleagues.  Patient says she understands and agrees with the plan.   Loleta Rose, MD 03/07/21 Claris Pong    Loleta Rose, MD 03/07/21 [phone]

## 2021-03-06 NOTE — ED Notes (Signed)
Pt endorsing that they "maybe had too much too drink, and my left ankle hurts." Pt L ankle appears swollen. Pt tearful in bed with MD at bedside. Pt arrived soiled and changed into dry gown

## 2021-03-06 NOTE — ED Notes (Signed)
Lab called and aware of labs

## 2021-03-06 NOTE — ED Notes (Signed)
PT awake and asking for water. Water provided at bedside for patient

## 2021-03-06 NOTE — ED Triage Notes (Signed)
Pt to ED via [facility] with c/o heavy ETOH use tonight, per EMS pt drank approx 4 pitchers of margaritas PTA. Per EMS pt has been hyperventilating en route with intermittent LOC en route. Pt arrives alert, hyperventilating, but able to answer questions and tearful on arrival.

## 2021-03-06 NOTE — Discharge Instructions (Addendum)
Please seek medical attention for any high fevers, chest pain, shortness of breath, change in behavior, persistent vomiting, bloody stool or any other new or concerning symptoms.  It is also important that you follow-up with Dr. Lilian Kapur or another podiatrist.  We got x-rays of your left foot and you do have a broken bone that may need care by a specialist.  Please use the provided a hard soled shoe and crutches; try to avoid bearing weight on your foot at least until he can follow-up with a podiatrist.  Use over-the-counter ibuprofen and/or Tylenol as needed for pain control.

## 2021-03-06 NOTE — ED Provider Notes (Signed)
[facility] Emergency Department Provider Note  ____________________________________________   I have reviewed the triage vital signs and the nursing notes.   HISTORY  Chief Complaint Alcohol Intoxication and Loss of Consciousness   History limited by: Intoxication   HPI Diane Beasley is a 25 y.o. female who presents to the emergency department today because of concerns for alcohol intoxication and syncopal episodes.  Patient does admit to drinking a large amount of alcohol tonight.  She states that she drank parts of for [employer].  The patient states that she then had a couple episodes of passing out.  Patient states that this is much more than she would normally drink.  She denies any headache.  Does states she had some vomiting earlier today as well.   Records reviewed. Per medical record review patient has a history of acute appendicitis.   Past Medical History:  Diagnosis Date   Acute appendicitis    Chlamydia     Patient Active Problem List   Diagnosis Date Noted   Acute appendicitis with localized peritonitis 02/14/2017   Chlamydia 11/13/2014    Past Surgical History:  Procedure Laterality Date   LAPAROSCOPIC APPENDECTOMY N/A 02/15/2017   Procedure: APPENDECTOMY LAPAROSCOPIC;  Surgeon: Ancil Linsey, MD;  Location: [facility] ORS;  Service: General;  Laterality: N/A;    Prior to Admission medications   Not on File    Allergies Patient has no known allergies.  Family History  Problem Relation Age of Onset   Diabetes Maternal Grandmother     Social History Social History   Tobacco Use   Smoking status: Never   Smokeless tobacco: Never  Vaping Use   Vaping Use: Never used  Substance Use Topics   Alcohol use: No   Drug use: No    Review of Systems Constitutional: No fever/chills Eyes: No visual changes. ENT: No sore throat. Cardiovascular: Denies chest pain. Respiratory: Denies shortness of  breath. Gastrointestinal: No abdominal pain. Positive for vomiting.  Genitourinary: Negative for dysuria. Musculoskeletal: Negative for back pain. Skin: Negative for rash. Neurological: Positive for syncopal episode.   ____________________________________________   PHYSICAL EXAM:  VITAL SIGNS: ED Triage Vitals [03/06/21 2151]  Enc Vitals Group     BP (!) 116/102     Pulse Rate 95     Resp (!) 26     Temp 98.1 F (36.7 C)     Temp Source Oral     SpO2 100 %     Weight 180 lb (81.6 kg)     Height 5\' 3"  (1.6 m)     Head Circumference      Peak Flow      Pain Score 0   Constitutional: Alert and oriented.  Eyes: Conjunctivae are normal.  ENT      Head: Normocephalic and atraumatic.      Nose: No congestion/rhinnorhea.      Mouth/Throat: Mucous membranes are moist.      Neck: No stridor. Hematological/Lymphatic/Immunilogical: No cervical lymphadenopathy. Cardiovascular: Normal rate, regular rhythm.  No murmurs, rubs, or gallops.  Respiratory: Normal respiratory effort without tachypnea nor retractions. Breath sounds are clear and equal bilaterally. No wheezes/rales/rhonchi. Gastrointestinal: Soft and non tender. No rebound. No guarding.  Genitourinary: Deferred Musculoskeletal: Normal range of motion in all extremities. No lower extremity edema. Neurologic:  Appears intoxicated.  Skin:  Skin is warm, dry and intact. No rash noted. Psychiatric: Tearful  ____________________________________________    LABS (pertinent positives/negatives)  CBC wbc 8.3, hgb 13.8,  plt 249 BMPm na 138, k 3.3, glu 117, cr 0.64  ____________________________________________   EKG  None  ____________________________________________    RADIOLOGY  None  ____________________________________________   PROCEDURES  Procedures  ____________________________________________   INITIAL IMPRESSION / ASSESSMENT AND PLAN / ED COURSE  Pertinent labs & imaging results that were  available during my care of the patient were reviewed by me and considered in my medical decision making (see chart for details).   Patient presented to the emergency department today because of concerns for alcohol intoxication syncopal episodes.  Patient's exam is consistent with intoxication.  Did check basic blood work given history of syncopal episodes although this did not show any concerning findings.  I do think likely patient was passing out from the alcohol use.  Will plan on observing in the emergency department for sobriety.   ____________________________________________   FINAL CLINICAL IMPRESSION(S) / ED DIAGNOSES  Final diagnoses:  Alcoholic intoxication without complication (HCC)     Note: This dictation was prepared with Dragon dictation. Any transcriptional errors that result from this process are unintentional     Phineas Semen, MD 03/06/21 2329

## 2021-03-07 ENCOUNTER — Emergency Department: Payer: Self-pay

## 2021-03-07 MED ORDER — ONDANSETRON 4 MG PO TBDP
4.0000 mg | ORAL_TABLET | Freq: Once | ORAL | Status: AC
  Administered 2021-03-07: 4 mg via ORAL
  Filled 2021-03-07: qty 1

## 2021-03-07 MED ORDER — ONDANSETRON 4 MG PO TBDP: ORAL_TABLET | ORAL | 0 refills | Status: AC

## 2021-03-07 NOTE — ED Notes (Signed)
Steward Drone (sister) called for safe ride home [phone]

## 2021-03-07 NOTE — ED Notes (Signed)
Patient tolerated PO fluids and snack; ambulated with independent steady gait; amenable to discharge home with safe ride

## 2021-03-11 ENCOUNTER — Telehealth: Payer: Self-pay | Admitting: Emergency Medicine

## 2021-03-11 NOTE — Telephone Encounter (Signed)
Patient called in stating she fell on 03/06/21 and hit her head on concrete. Patient is c/o headache, generalized pain and has a lump on the back of her head. Patient states she went to the ED for evaluation, but she was asleep most of the time (alcohol intoxication) and she states she wasn't asked about a headache. I spoke with Lanora Manis, NP and she advised go to ED for additional evaluation. Advised patient of above and patient voiced understanding and agreement.

## 2021-03-12 ENCOUNTER — Other Ambulatory Visit: Payer: Self-pay

## 2021-03-12 ENCOUNTER — Ambulatory Visit (INDEPENDENT_AMBULATORY_CARE_PROVIDER_SITE_OTHER): Payer: Self-pay | Admitting: Podiatry

## 2021-03-12 DIAGNOSIS — S92352D Displaced fracture of fifth metatarsal bone, left foot, subsequent encounter for fracture with routine healing: Secondary | ICD-10-CM

## 2021-03-17 NOTE — Progress Notes (Signed)
  Subjective:  Patient ID: Diane Beasley, female    DOB: April 30, 1996,  MRN: [id number]  Chief Complaint  Patient presents with   Foot Pain    Left foot pain     25 y.o. female presents with the above complaint.  Patient presents with complaint of left fifth metatarsal fracture.  Patient states she went to the [facility] emergency room to get it evaluated and was told that she has a fracture.  She was placed in a boot and was allowed to ambulate.  Her date of injury was 02/14/2021.  She states is still very painful to touch has not gotten better.  She would like to discuss treatment options he has not seen anyone as prior to see me.  She denies any other acute complaints pain scale is 8 out of 10 with ambulation.   Review of Systems: Negative except as noted in the HPI. Denies N/V/F/Ch.  Past Medical History:  Diagnosis Date   Acute appendicitis    Chlamydia     Current Outpatient Medications:    ondansetron (ZOFRAN ODT) 4 MG disintegrating tablet, Allow 1-2 tablets to dissolve in your mouth every 8 hours as needed for nausea/vomiting, Disp: 30 tablet, Rfl: 0  Social History   Tobacco Use  Smoking Status Never  Smokeless Tobacco Never    No Known Allergies Objective:  There were no vitals filed for this visit. There is no height or weight on file to calculate BMI. Constitutional Well developed. Well nourished.  Vascular Dorsalis pedis pulses palpable bilaterally. Posterior tibial pulses palpable bilaterally. Capillary refill normal to all digits.  No cyanosis or clubbing noted. Pedal hair growth normal.  Neurologic Normal speech. Oriented to person, place, and time. Epicritic sensation to light touch grossly present bilaterally.  Dermatologic Nails well groomed and normal in appearance. No open wounds. No skin lesions.  Orthopedic: Pain on palpation of the left fifth metatarsal base.  No pain along the course of the peroneal tendon.  Mild pain with resisted  dorsiflexion eversion of the foot.  Pain along the shaft of the metatarsal.   Radiographs: 3 views of skeletally mature adult left foot reviewed: Acute slightly comminuted fracture extending through the base of the left fifth metatarsal with minimal displacement. Assessment:   1. Closed fracture of base of fifth metatarsal bone with routine healing, left    Plan:  Patient was evaluated and treated and all questions answered.  Left fifth metatarsal base fracture -I explained to the patient the etiology of fracture and various treatment options were extensively discussed.  At this time given that this is very minimally displaced without any gapping I believe patient will benefit from conservative treatment options without any surgical intervention.  I discussed this with the patient in extensive detail she states understanding would like to proceed with cam boot immobilization. -I encouraged her to continue utilizing the cam boot I discussed with her that it can take anywhere from 6 to 8 weeks to get better.  She states understanding. -If there is no resolve then we will discuss bone stimulator.  No follow-ups on file.

## 2021-04-28 ENCOUNTER — Ambulatory Visit: Payer: Self-pay | Admitting: Podiatry

## 2022-04-20 ENCOUNTER — Ambulatory Visit: Admission: EM | Admit: 2022-04-20 | Discharge: 2022-04-20 | Payer: Self-pay

## 2022-04-20 ENCOUNTER — Encounter: Payer: Self-pay | Admitting: Emergency Medicine

## 2022-04-20 ENCOUNTER — Other Ambulatory Visit: Payer: Self-pay

## 2022-04-20 ENCOUNTER — Emergency Department
Admission: EM | Admit: 2022-04-20 | Discharge: 2022-04-20 | Disposition: A | Discharge status: Home / Self Care | Payer: Self-pay | Attending: Student in an Organized Health Care Education/Training Program | Admitting: Student in an Organized Health Care Education/Training Program

## 2022-04-20 ENCOUNTER — Emergency Department: Payer: Self-pay

## 2022-04-20 DIAGNOSIS — J36 Peritonsillar abscess: Secondary | ICD-10-CM | POA: Insufficient documentation

## 2022-04-20 LAB — BASIC METABOLIC PANEL
Anion gap: 7 (ref 5–15)
BUN: 9 mg/dL (ref 6–20)
CO2: 25 mmol/L (ref 22–32)
Calcium: 8.8 mg/dL — ABNORMAL LOW (ref 8.9–10.3)
Chloride: 106 mmol/L (ref 98–111)
Creatinine, Ser: 0.57 mg/dL (ref 0.44–1.00)
GFR, Estimated: >60 mL/min (ref >60)
Glucose, Bld: 108 mg/dL — ABNORMAL HIGH (ref 70–99)
Potassium: 3.8 mmol/L (ref 3.5–5.1)
Sodium: 138 mmol/L (ref 135–145)

## 2022-04-20 LAB — CBC
HCT: 37.5 % (ref 36.0–46.0)
Hemoglobin: 12.6 g/dL (ref 12.0–15.0)
MCH: 31 pg (ref 26.0–34.0)
MCHC: 33.6 g/dL (ref 30.0–36.0)
MCV: 92.1 fL (ref 80.0–100.0)
Platelets: 282 10*3/uL (ref 150–400)
RBC: 4.07 MIL/uL (ref 3.87–5.11)
RDW: 12.9 % (ref 11.5–15.5)
WBC: 11.8 10*3/uL — ABNORMAL HIGH (ref 4.0–10.5)
nRBC: 0 % (ref 0.0–0.2)

## 2022-04-20 MED ORDER — AMOXICILLIN-POT CLAVULANATE 875-125 MG PO TABS
1.0000 | ORAL_TABLET | Freq: Once | ORAL | Status: AC
  Administered 2022-04-20: 1 via ORAL
  Filled 2022-04-20: qty 1

## 2022-04-20 MED ORDER — DEXAMETHASONE SODIUM PHOSPHATE 10 MG/ML IJ SOLN
10.0000 mg | Freq: Once | INTRAMUSCULAR | Status: AC
  Administered 2022-04-20: 10 mg via INTRAVENOUS
  Filled 2022-04-20: qty 1

## 2022-04-20 MED ORDER — LIDOCAINE HCL (PF) 4 % IJ SOLN
5.0000 mL | Freq: Once | INTRAMUSCULAR | Status: AC
  Administered 2022-04-20: 5 mL via RESPIRATORY_TRACT
  Filled 2022-04-20 (×3): qty 5

## 2022-04-20 MED ORDER — LIDOCAINE-EPINEPHRINE 2 %-1:100000 IJ SOLN
20.0000 mL | Freq: Once | INTRAMUSCULAR | Status: DC
  Filled 2022-04-20: qty 1

## 2022-04-20 MED ORDER — ONDANSETRON HCL 4 MG/2ML IJ SOLN
4.0000 mg | Freq: Once | INTRAMUSCULAR | Status: AC
  Administered 2022-04-20: 4 mg via INTRAVENOUS
  Filled 2022-04-20: qty 2

## 2022-04-20 MED ORDER — AMOXICILLIN-POT CLAVULANATE 500-125 MG PO TABS: 1.0000 | ORAL_TABLET | Freq: Two times a day (BID) | ORAL | 0 refills | Status: AC

## 2022-04-20 MED ORDER — PREDNISONE 10 MG PO TABS: 10.0000 mg | ORAL_TABLET | Freq: Every day | ORAL | 0 refills | Status: AC

## 2022-04-20 MED ORDER — OXYCODONE-ACETAMINOPHEN 5-325 MG PO TABS: 1.0000 | ORAL_TABLET | ORAL | 0 refills | Status: AC | PRN

## 2022-04-20 MED ORDER — SODIUM CHLORIDE 0.9 % IV SOLN
3.0000 g | Freq: Once | INTRAVENOUS | Status: AC
  Administered 2022-04-20: 3 g via INTRAVENOUS
  Filled 2022-04-20: qty 8

## 2022-04-20 MED ORDER — SODIUM CHLORIDE 0.9 % IV BOLUS
1000.0000 mL | Freq: Once | INTRAVENOUS | Status: AC
  Administered 2022-04-20: 1000 mL via INTRAVENOUS

## 2022-04-20 MED ORDER — MORPHINE SULFATE (PF) 4 MG/ML IV SOLN
4.0000 mg | INTRAVENOUS | Status: DC | PRN
  Administered 2022-04-20: 4 mg via INTRAVENOUS
  Filled 2022-04-20: qty 1

## 2022-04-20 MED ORDER — AMOXICILLIN-POT CLAVULANATE 875-125 MG PO TABS: 1.0000 | ORAL_TABLET | Freq: Once | ORAL | Status: DC

## 2022-04-20 MED ORDER — IOHEXOL 300 MG/ML  SOLN
75.0000 mL | Freq: Once | INTRAMUSCULAR | Status: AC | PRN
  Administered 2022-04-20: 75 mL via INTRAVENOUS

## 2022-04-20 MED ORDER — KETOROLAC TROMETHAMINE 30 MG/ML IJ SOLN
15.0000 mg | Freq: Once | INTRAMUSCULAR | Status: AC
  Administered 2022-04-20: 15 mg via INTRAVENOUS
  Filled 2022-04-20: qty 1

## 2022-04-20 NOTE — ED Triage Notes (Signed)
Pt via POV from home. Pt was sent from [facility] for L sided peritonsillar abscess. Pt states sore throat started on Thursday but pain got worse on Saturday. Pt took a left over prescription of Penicillin. Pt took some Tylenol right before you arrived. Pt is A&Ox4 and NAD

## 2022-04-20 NOTE — ED Provider Notes (Signed)
[facility] Provider Note    Event Date/Time   First MD Initiated Contact with Patient 04/20/22 1822     (approximate)   History   Abscess   HPI  Diane Beasley is a 26 y.o. female who presents to the ER for about 5 days of worsening left-sided sore throat.  Patient had some penicillin at home that she took without improvement with urgent care today and was sent to the ER due to concern for peritonsillar abscess.  She is having trouble swallowing but tolerating liquids.     Physical Exam   Triage Vital Signs: ED Triage Vitals  Enc Vitals Group     BP 04/20/22 1804 (!) 137/91     Pulse Rate 04/20/22 1804 84     Resp 04/20/22 1804 20     Temp 04/20/22 1804 98.9 F (37.2 C)     Temp Source 04/20/22 1804 Oral     SpO2 04/20/22 1804 100 %     Weight 04/20/22 1757 180 lb (81.6 kg)     Height 04/20/22 1757 5\' 4"  (1.626 m)     Head Circumference --      Peak Flow --      Pain Score 04/20/22 1757 9     Pain Loc --      Pain Edu? --      Excl. in GC? --     Most recent vital signs: Vitals:   04/20/22 1804 04/20/22 2007  BP: (!) 137/91 121/61  Pulse: 84 76  Resp: 20 18  Temp: 98.9 F (37.2 C)   SpO2: 100% 100%     Constitutional: Alert  Eyes: Conjunctivae are normal.  Head: Atraumatic. Nose: No congestion/rhinnorhea. Mouth/Throat: Muffled voice with rightward uvular deviation and exam consistent with left peritonsillar abscess.  Mild trismus.  No stridor.  She is tolerating secretions. Neck: Painless ROM.  Cardiovascular:   Good peripheral circulation. Respiratory: Normal respiratory effort.  No retractions.  Gastrointestinal: Soft and nontender.  Musculoskeletal:  no deformity Neurologic:  MAE spontaneously. No gross focal neurologic deficits are appreciated.  Skin:  Skin is warm, dry and intact. No rash noted. Psychiatric: Mood and affect are normal. Speech and behavior are normal.    ED Results / Procedures /  Treatments   Labs (all labs ordered are listed, but only abnormal results are displayed) Labs Reviewed  CBC - Abnormal; Notable for the following components:      Result Value   WBC 11.8 (*)    All other components within normal limits  BASIC METABOLIC PANEL - Abnormal; Notable for the following components:   Glucose, Bld 108 (*)    Calcium 8.8 (*)    All other components within normal limits  AEROBIC/ANAEROBIC CULTURE W GRAM STAIN (SURGICAL/DEEP WOUND)  POC URINE PREG, ED     EKG     RADIOLOGY Please see ED Course for my review and interpretation.  I personally reviewed all radiographic images ordered to evaluate for the above acute complaints and reviewed radiology reports and findings.  These findings were personally discussed with the patient.  Please see medical record for radiology report.    PROCEDURES:  Critical Care performed:   11/23/23Marland KitchenIncision and Drainage  Date/Time: 04/20/2022 8:55 PM  Performed by: 04/22/2022, MD Authorized by: Willy Eddy, MD   Consent:    Consent obtained:  Verbal   Consent given by:  Patient   Risks discussed:  Bleeding, infection, incomplete drainage and pain  Alternatives discussed:  Alternative treatment, delayed treatment and observation Location:    Type:  Abscess   Location:  Mouth   Mouth location:  Peritonsillar Anesthesia:    Anesthesia method:  Local infiltration   Local anesthetic:  Lidocaine 1% w/o epi Procedure type:    Complexity:  Complex Post-procedure details:    Procedure completion:  Tolerated well, no immediate complications    MEDICATIONS ORDERED IN ED: Medications  morphine (PF) 4 MG/ML injection 4 mg (4 mg Intravenous Given 04/20/22 1957)  lidocaine-EPINEPHrine (XYLOCAINE W/EPI) 2 %-1:100000 (with pres) injection 20 mL (has no administration in time range)  amoxicillin-clavulanate (AUGMENTIN) 875-125 MG per tablet 1 tablet (has no administration in time range)  dexamethasone (DECADRON)  injection 10 mg (10 mg Intravenous Given 04/20/22 1852)  sodium chloride 0.9 % bolus 1,000 mL (0 mLs Intravenous Stopped 04/20/22 1928)  Ampicillin-Sulbactam (UNASYN) 3 g in sodium chloride 0.9 % 100 mL IVPB (0 g Intravenous Stopped 04/20/22 1928)  ketorolac (TORADOL) 30 MG/ML injection 15 mg (15 mg Intravenous Given 04/20/22 1853)  iohexol (OMNIPAQUE) 300 MG/ML solution 75 mL (75 mLs Intravenous Contrast Given 04/20/22 1909)  lidocaine (PF) (XYLOCAINE) 4 % (PF) injection 5 mL (5 mLs Inhalation Given 04/20/22 1957)  ondansetron (ZOFRAN) injection 4 mg (4 mg Intravenous Given 04/20/22 1957)     IMPRESSION / MDM / ASSESSMENT AND PLAN / ED COURSE  I reviewed the triage vital signs and the nursing notes.                              Differential diagnosis includes, but is not limited to, pta, rpa, pharyngitis, epiglottis  Patient presenting to the ER for evaluation of symptoms as described above.  Based on symptoms, risk factors and considered above differential, this presenting complaint could reflect a potentially life-threatening illness therefore the patient will be placed on continuous pulse oximetry and telemetry for monitoring.  Laboratory evaluation will be sent to evaluate for the above complaints.  Imaging will be ordered to evaluate for the but differential I am clinically concerned for peritonsillar abscess but somewhat difficult exam due to patient's trismus.   Clinical Course as of 04/20/22 2104  Tue Apr 20, 2022  1922 Imaging on my review and interpretation consistent with large left peritonsillar abscess. Will plan I&D. [PR]  2037 Patient tolerated bedside I&D relieved roughly 7 cc of purulent fluid drained from abscess.  Superior and inferior poles were probed as well unable to drain any additional purulent fluid.  Her voice is significantly better.  She feels clinically improved. [PR]  2103 Patient reassessed.  She continues to clinically improve.  At this point I do believe she  stable and appropriate for outpatient follow-up.  We discussed strict return precautions as well as given referral to ENT.  Patient agreeable to plan. [PR]    Clinical Course User Index [PR] Willy Eddy, MD      FINAL CLINICAL IMPRESSION(S) / ED DIAGNOSES   Final diagnoses:  Peritonsillar abscess     Rx / DC Orders   ED Discharge Orders          Ordered    amoxicillin-clavulanate (AUGMENTIN) 500-125 MG tablet  2 times daily        04/20/22 2055    predniSONE (DELTASONE) 10 MG tablet  Daily        04/20/22 2055    oxyCODONE-acetaminophen (PERCOCET) 5-325 MG tablet  Every 4 hours PRN  04/20/22 2055             Note:  This document was prepared using Dragon voice recognition software and may include unintentional dictation errors.    Willy Eddy, MD 04/20/22 2104

## 2022-04-20 NOTE — ED Provider Notes (Signed)
[facility]    CSN: [id number] Arrival date & time: 04/20/22  1626      History   Chief Complaint Chief Complaint  Patient presents with   Sore Throat    HPI Diane Beasley is a 26 y.o. female presenting for 6 day history of sore throat and painful swallowing.  Patient says she had strep earlier this year.  Reports that she has been taking old prescription of penicillin 3 times per day over the past 4 to 5 days.  States symptoms have not improved.  Reports difficulty swallowing.  Able to swallow her secretions.  Reports that she can only eat soft foods.  Has not been drinking much fluids.  Unsure if she has had any fever.  Has been taking Tylenol Motrin.  Denies any cough or congestion.  No breathing difficulty or wheezing.  No other complaints.  HPI  Past Medical History:  Diagnosis Date   Acute appendicitis    Chlamydia     Patient Active Problem List   Diagnosis Date Noted   Acute appendicitis with localized peritonitis 02/14/2017   Chlamydia 11/13/2014    Past Surgical History:  Procedure Laterality Date   LAPAROSCOPIC APPENDECTOMY N/A 02/15/2017   Procedure: APPENDECTOMY LAPAROSCOPIC;  Surgeon: Ancil Linsey, MD;  Location: [facility] OR[facility];  Service: General;  Laterality: N/A;    OB History   No obstetric history on file.      Home Medications    Prior to Admission medications   Medication Sig Start Date End Date Taking? Authorizing Provider  ondansetron (ZOFRAN ODT) 4 MG disintegrating tablet Allow 1-2 tablets to dissolve in your mouth every 8 hours as needed for nausea/vomiting 03/07/21   Loleta Rose, MD    Family History Family History  Problem Relation Age of Onset   Diabetes Maternal Grandmother     Social History Social History   Tobacco Use   Smoking status: Never   Smokeless tobacco: Never  Vaping Use   Vaping Use: Never used  Substance Use Topics   Alcohol use: No   Drug use: No     Allergies   Patient has  no known allergies.   Review of Systems Review of Systems  Constitutional:  Positive for fatigue. Negative for chills, diaphoresis and fever.  HENT:  Positive for sore throat and trouble swallowing. Negative for congestion, ear pain, rhinorrhea, sinus pressure and sinus pain.   Respiratory:  Negative for cough and shortness of breath.   Gastrointestinal:  Negative for abdominal pain, nausea and vomiting.  Musculoskeletal:  Negative for arthralgias and myalgias.  Skin:  Negative for rash.  Neurological:  Negative for weakness and headaches.  Hematological:  Negative for adenopathy.     Physical Exam Triage Vital Signs ED Triage Vitals  Enc Vitals Group     BP      Pulse      Resp      Temp      Temp src      SpO2      Weight      Height      Head Circumference      Peak Flow      Pain Score      Pain Loc      Pain Edu?      Excl. in GC?    No data found.  Updated Vital Signs BP 128/85 (BP Location: Left Arm)   Pulse 75   Temp 98.8 F (37.1 C) (Oral)  Resp 16   LMP 04/13/2022 (Approximate)   SpO2 100%     Physical Exam Vitals and nursing note reviewed.  Constitutional:      General: She is not in acute distress.    Appearance: Normal appearance. She is well-developed. She is ill-appearing. She is not toxic-appearing.     Comments: +hot potato voice  HENT:     Head: Normocephalic and atraumatic.     Nose: Nose normal.     Mouth/Throat:     Mouth: Mucous membranes are moist.     Pharynx: Oropharynx is clear. Posterior oropharyngeal erythema present.     Tonsils: 2+ on the right. 4+ on the left.  Eyes:     General: No scleral icterus.       Right eye: No discharge.        Left eye: No discharge.     Conjunctiva/sclera: Conjunctivae normal.  Cardiovascular:     Rate and Rhythm: Normal rate and regular rhythm.     Heart sounds: Normal heart sounds.  Pulmonary:     Effort: Pulmonary effort is normal. No respiratory distress.     Breath sounds: Normal  breath sounds.  Musculoskeletal:     Cervical back: Neck supple.  Lymphadenopathy:     Cervical: Cervical adenopathy present.  Skin:    General: Skin is dry.  Neurological:     General: No focal deficit present.     Mental Status: She is alert. Mental status is at baseline.     Motor: No weakness.     Gait: Gait normal.  Psychiatric:        Mood and Affect: Mood normal.        Behavior: Behavior normal.        Thought Content: Thought content normal.      UC Treatments / Results  Labs (all labs ordered are listed, but only abnormal results are displayed) Labs Reviewed - No data to display  EKG   Radiology No results found.  Procedures Procedures (including critical care time)  Medications Ordered in UC Medications - No data to display  Initial Impression / Assessment and Plan / UC Course  I have reviewed the triage vital signs and the nursing notes.  Pertinent labs & imaging results that were available during my care of the patient were reviewed by me and considered in my medical decision making (see chart for details).   26 year old female presents for approximately 6-day history of sore throat and painful swallowing.  Reports she assumed she had strep so she started taking all prescription of penicillin has been taking that for at least the last 4 to 5 days.  No improvement in symptoms.  Unsure if she has had a fever as she has been taking Tylenol /Motrin for pain.  Difficulty swallowing.  Able to manage her secretions at this time.  No breathing trouble.  On examination she has significant erythema of the posterior pharynx.  Heart potato voice noted.  4+ enlarged tonsil on the left side and 2+ on the right.  High concern for peritonsillar abscess.  Advised patient immediate workup in the emergency department.  She has failed taking penicillin.  Advise she likely will need CT imaging, IV antibiotics, possible steroids.  Possible ENT consult depending on the severity of  the suspected abscess.  Patient is agreeable and plans to go to [facility] at this time.  She would like to drive herself.  She is leaving in stable condition.   Final Clinical Impressions(s) /  UC Diagnoses   Final diagnoses:  Peritonsillar abscess     Discharge Instructions      You have been advised to follow up immediately in the emergency department for concerning signs.symptoms. If you declined EMS transport, please have a family member take you directly to the ED at this time. Do not delay. Based on concerns about condition, if you do not follow up in th e ED, you may risk poor outcomes including worsening of condition, delayed treatment and potentially life threatening issues. If you have declined to go to the ED at this time, you should call your PCP immediately to set up a follow up appointment.  Go to ED for red flag symptoms, including; fevers you cannot reduce with Tylenol/Motrin, severe headaches, vision changes, numbness/weakness in part of the body, lethargy, confusion, intractable vomiting, severe dehydration, chest pain, breathing difficulty, severe persistent abdominal or pelvic pain, signs of severe infection (increased redness, swelling of an area), feeling faint or passing out, dizziness, etc. You should especially go to the ED for sudden acute worsening of condition if you do not elect to go at this time.      ED Prescriptions   None    PDMP not reviewed this encounter.   Shirlee Latch, PA-C 04/20/22 1721

## 2022-04-20 NOTE — ED Triage Notes (Signed)
Pt presents with a sore throat x 1 week. Pt took left over abx for the past 4 days since her symptoms were similar to strep throat.

## 2022-04-20 NOTE — Discharge Instructions (Signed)

## 2022-04-20 NOTE — ED Notes (Signed)
Patient is being discharged from the Urgent Care and sent to the Emergency Department via personal vehicle . Per Eusebio Friendly PA, patient is in need of higher level of care due to peritonsillar abscess. Patient is aware and verbalizes understanding of plan of care.  Vitals:   04/20/22 1651 04/20/22 1654  BP:  128/85  Pulse: 75   Resp: 16   Temp: 98.8 F (37.1 C)   SpO2: 100%

## 2022-04-21 ENCOUNTER — Other Ambulatory Visit: Payer: Self-pay

## 2022-04-21 IMAGING — DX DG FOOT COMPLETE 3+V*L*
3 series · 3 of 3 positions shown · non-contrast
Comparison: None available.

CLINICAL DATA: Initial evaluation for acute pain status post fall.

EXAM:
LEFT FOOT - COMPLETE 3+ VIEW

[foot ap]
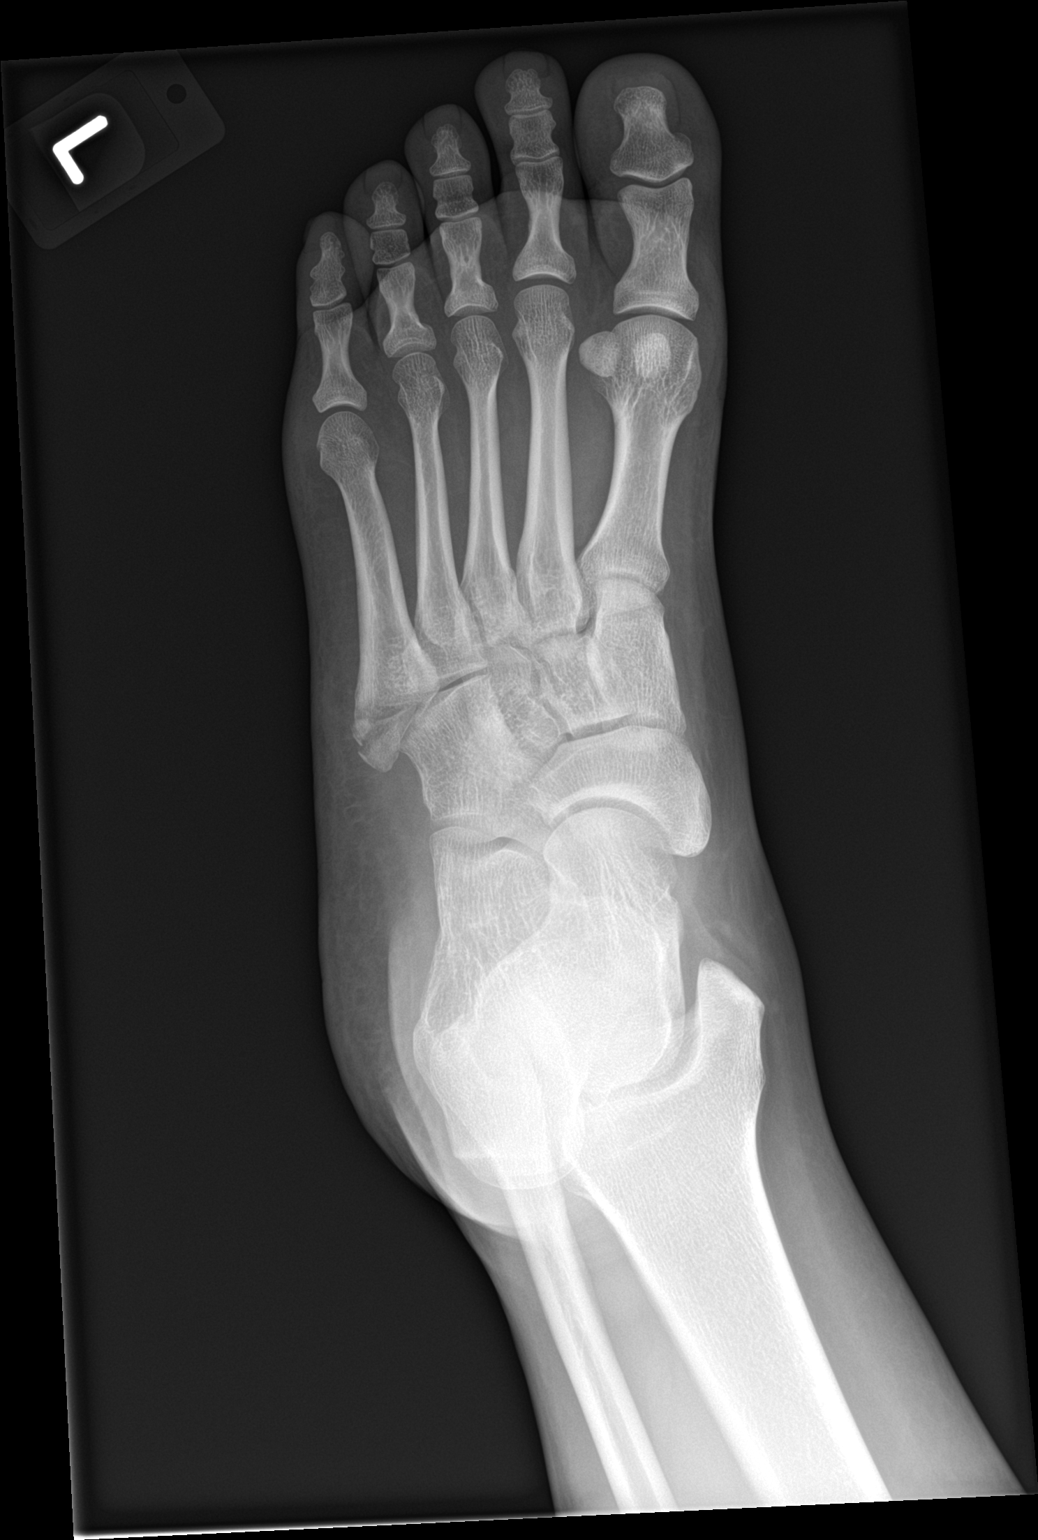

[foot obl]
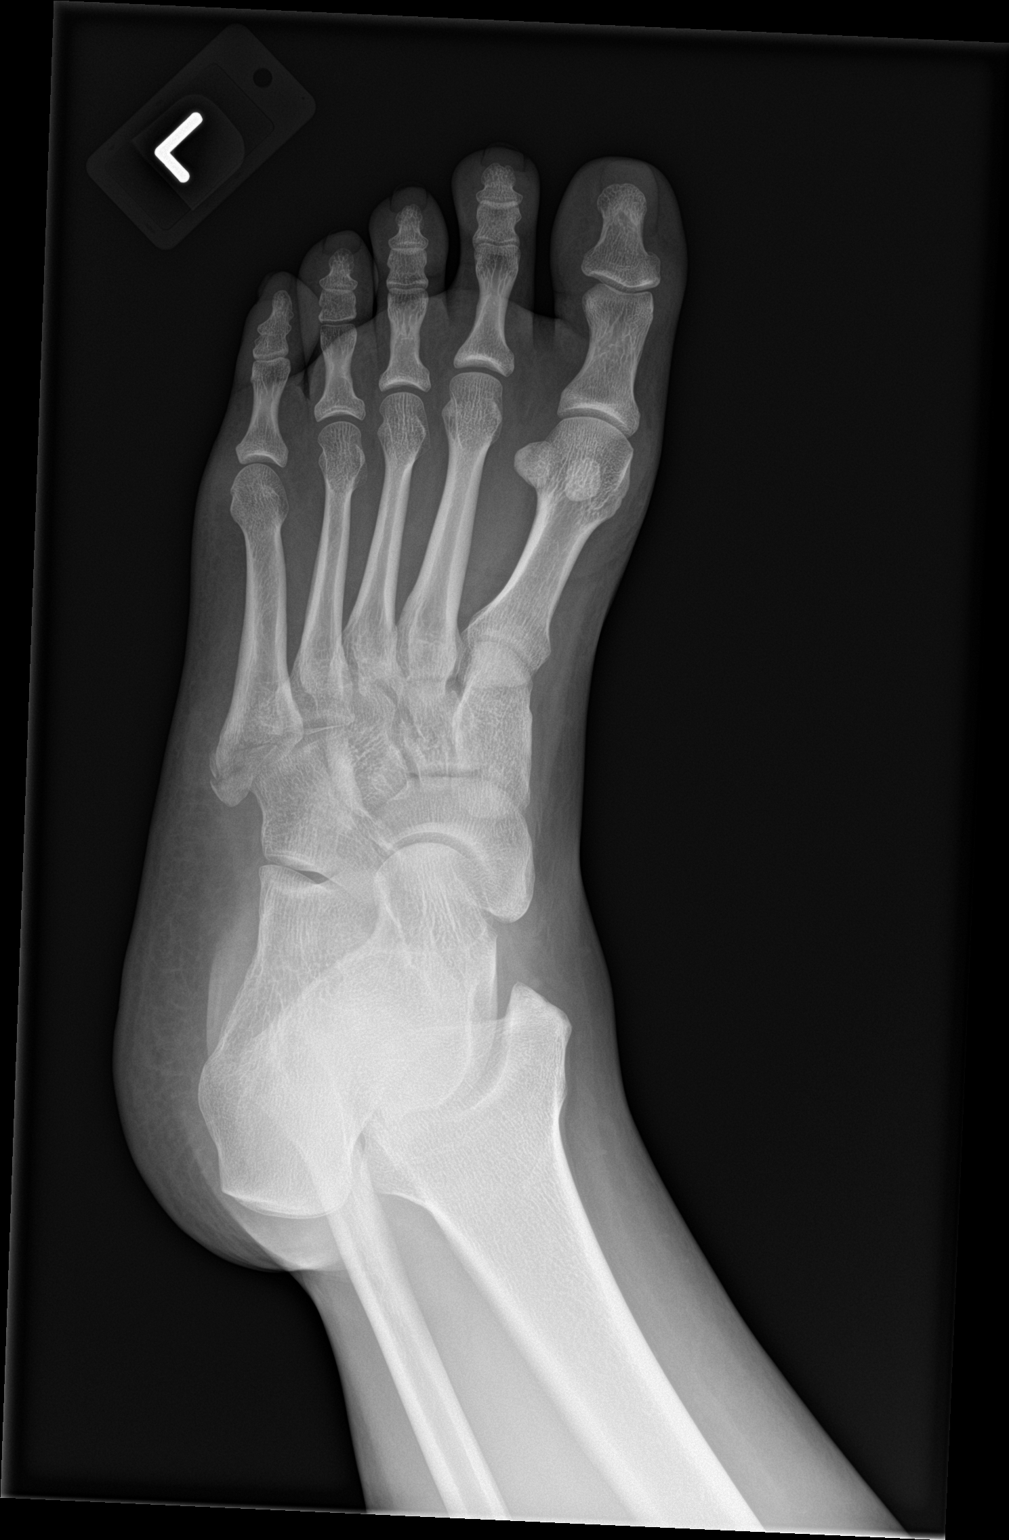

[foot lat]
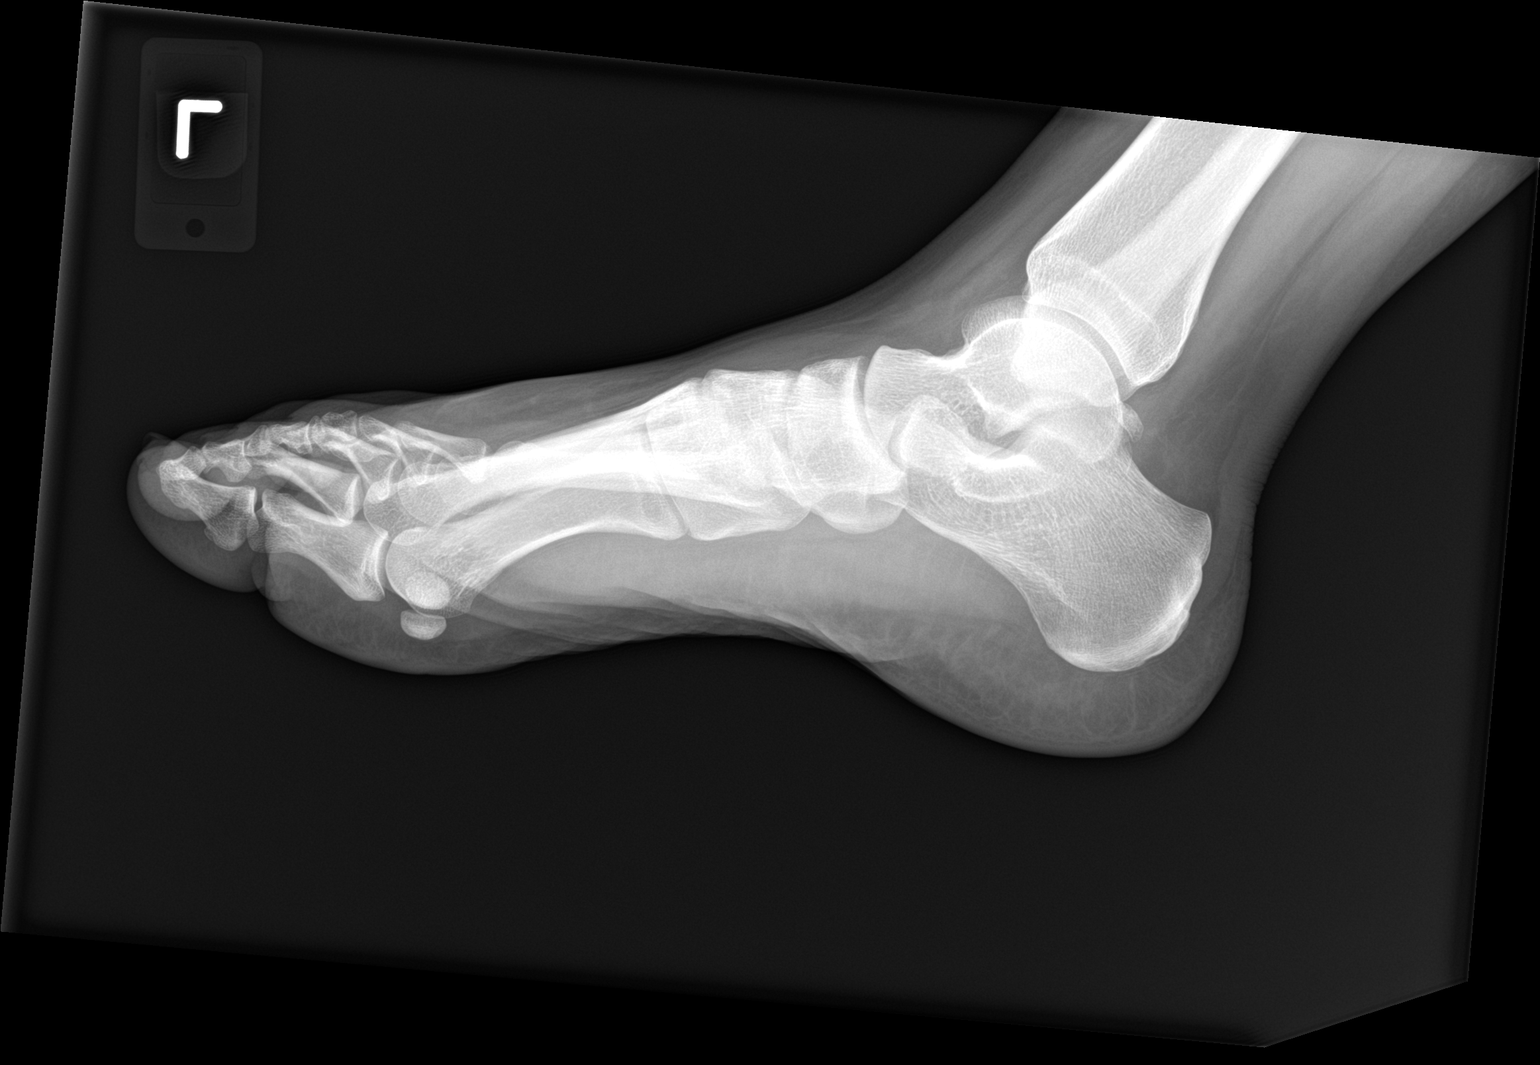

[3 of 3 positions shown; findings below may reference images not displayed]

FINDINGS: Acute slightly comminuted fracture extending through the base of the
left fifth metatarsal is seen with minimal displacement.

No other acute fracture or dislocation. Joint spaces maintained.
Osseous mineralization normal. No visible soft tissue injury.
IMPRESSION: Acute slightly comminuted fracture extending through the base of the
left fifth metatarsal with minimal displacement.

## 2022-04-23 LAB — AEROBIC/ANAEROBIC CULTURE W GRAM STAIN (SURGICAL/DEEP WOUND): Culture: NORMAL

## 2023-05-10 ENCOUNTER — Encounter: Payer: Self-pay | Admitting: Emergency Medicine

## 2023-05-10 ENCOUNTER — Emergency Department
Admission: EM | Admit: 2023-05-10 | Discharge: 2023-05-10 | Disposition: A | Discharge status: Home / Self Care | Payer: Self-pay | Attending: Emergency Medicine | Admitting: Emergency Medicine

## 2023-05-10 ENCOUNTER — Emergency Department: Payer: Self-pay

## 2023-05-10 ENCOUNTER — Other Ambulatory Visit: Payer: Self-pay

## 2023-05-10 DIAGNOSIS — R1032 Left lower quadrant pain: Secondary | ICD-10-CM | POA: Insufficient documentation

## 2023-05-10 DIAGNOSIS — R102 Pelvic and perineal pain: Secondary | ICD-10-CM | POA: Insufficient documentation

## 2023-05-10 DIAGNOSIS — R109 Unspecified abdominal pain: Secondary | ICD-10-CM

## 2023-05-10 LAB — CBC WITH DIFFERENTIAL/PLATELET
Abs Immature Granulocytes: 0.02 10*3/uL (ref 0.00–0.07)
Basophils Absolute: 0 10*3/uL (ref 0.0–0.1)
Basophils Relative: 0 %
Eosinophils Absolute: 0.1 10*3/uL (ref 0.0–0.5)
Eosinophils Relative: 1 %
HCT: 39.8 % (ref 36.0–46.0)
Hemoglobin: 13.7 g/dL (ref 12.0–15.0)
Immature Granulocytes: 0 %
Lymphocytes Relative: 26 %
Lymphs Abs: 2.1 10*3/uL (ref 0.7–4.0)
MCH: 31.4 pg (ref 26.0–34.0)
MCHC: 34.4 g/dL (ref 30.0–36.0)
MCV: 91.3 fL (ref 80.0–100.0)
Monocytes Absolute: 0.4 10*3/uL (ref 0.1–1.0)
Monocytes Relative: 5 %
Neutro Abs: 5.4 10*3/uL (ref 1.7–7.7)
Neutrophils Relative %: 68 %
Platelets: 256 10*3/uL (ref 150–400)
RBC: 4.36 MIL/uL (ref 3.87–5.11)
RDW: 12.2 % (ref 11.5–15.5)
WBC: 8.1 10*3/uL (ref 4.0–10.5)
nRBC: 0 % (ref 0.0–0.2)

## 2023-05-10 LAB — URINALYSIS, ROUTINE W REFLEX MICROSCOPIC
Bilirubin Urine: NEGATIVE
Glucose, UA: NEGATIVE mg/dL
Ketones, ur: NEGATIVE mg/dL
Leukocytes,Ua: NEGATIVE
Nitrite: NEGATIVE
Protein, ur: NEGATIVE mg/dL
RBC / HPF: >50 RBC/hpf (ref 0–5)
Specific Gravity, Urine: 1.019 (ref 1.005–1.030)
pH: 5 (ref 5.0–8.0)

## 2023-05-10 LAB — PREGNANCY, URINE: Preg Test, Ur: NEGATIVE

## 2023-05-10 LAB — COMPREHENSIVE METABOLIC PANEL
ALT: 27 U/L (ref 0–44)
AST: 21 U/L (ref 15–41)
Albumin: 4.1 g/dL (ref 3.5–5.0)
Alkaline Phosphatase: 71 U/L (ref 38–126)
Anion gap: 10 (ref 5–15)
BUN: 10 mg/dL (ref 6–20)
CO2: 22 mmol/L (ref 22–32)
Calcium: 8.8 mg/dL — ABNORMAL LOW (ref 8.9–10.3)
Chloride: 105 mmol/L (ref 98–111)
Creatinine, Ser: 0.57 mg/dL (ref 0.44–1.00)
GFR, Estimated: >60 mL/min (ref >60)
Glucose, Bld: 103 mg/dL — ABNORMAL HIGH (ref 70–99)
Potassium: 3.8 mmol/L (ref 3.5–5.1)
Sodium: 137 mmol/L (ref 135–145)
Total Bilirubin: 0.8 mg/dL (ref <1.2)
Total Protein: 7.6 g/dL (ref 6.5–8.1)

## 2023-05-10 LAB — HCG, QUANTITATIVE, PREGNANCY: hCG, Beta Chain, Quant, S: <1 m[IU]/mL (ref <5)

## 2023-05-10 LAB — LIPASE, BLOOD: Lipase: 27 U/L (ref 11–51)

## 2023-05-10 MED ORDER — LIDOCAINE 5 % EX PTCH
1.0000 | MEDICATED_PATCH | CUTANEOUS | Status: DC
  Administered 2023-05-10: 1 via TRANSDERMAL
  Filled 2023-05-10: qty 1

## 2023-05-10 MED ORDER — KETOROLAC TROMETHAMINE 30 MG/ML IJ SOLN
30.0000 mg | Freq: Once | INTRAMUSCULAR | Status: AC
Start: 2023-05-10 — End: 2023-05-10
  Administered 2023-05-10: 30 mg via INTRAMUSCULAR
  Filled 2023-05-10: qty 1

## 2023-05-10 MED ORDER — ACETAMINOPHEN 325 MG PO TABS
650.0000 mg | ORAL_TABLET | Freq: Once | ORAL | Status: AC
  Administered 2023-05-10: 650 mg via ORAL
  Filled 2023-05-10: qty 2

## 2023-05-10 MED ORDER — CYCLOBENZAPRINE HCL 5 MG PO TABS: 5.0000 mg | ORAL_TABLET | Freq: Three times a day (TID) | ORAL | 0 refills | Status: AC | PRN

## 2023-05-10 NOTE — Discharge Instructions (Signed)
Please follow-up with a primary care physician.  A list has been provided for you below.  Take ibuprofen for pain as needed.  Get plenty of rest and stay hydrated.  Pick up medication from pharmacy as discussed for muscle relaxant.  Please go to the following website to schedule new (and existing) patient appointments:   [email or web address]   The following is a list of primary care offices in the area who are accepting new patients at this time.  Please reach out to one of them directly and let them know you would like to schedule an appointment to follow up on an Emergency Department visit, and/or to establish a new primary care provider (PCP).  There are likely other primary care clinics in the are who are accepting new patients, but this is an excellent place to start:  [facility] Lead physician: Dr Shirlee Latch [address] [place], [place] [address] [phone]  [facility] Lead Physician: Dr Alba Cory [address], [place], [place] [address] [phone]  [facility]  Lead Physician: Dr Olevia Perches [address] [place], [place] [address] [phone]  [facility] Lead Physician: Dr Sofie Hartigan [address] [place], [place] [address] [phone]  [facility] at [facility] Lead Physician: Dr Bari Edward [address] [place], [place], [place] [address] [phone]

## 2023-05-10 NOTE — ED Provider Notes (Signed)
[facility] Provider Note    Event Date/Time   First MD Initiated Contact with Patient 05/10/23 1603     (approximate)   History   Flank Pain   HPI  Diane Beasley is a 27 y.o. female with history of chlamydia, acute appendicitis.  Presents emergency department with left lower quadrant pain.  Patient states that she has had some left lower quadrant and right left upper quadrant pain that hurts more when she leans back and stretches.  Unsure if it is from a pulled muscle.  No vaginal discharge.  No blood in her urine.  Patient is currently on her menstrual cycle.  Denies fever or chills      Physical Exam   Triage Vital Signs: ED Triage Vitals  Encounter Vitals Group     BP 05/10/23 1250 (!) 138/93     Systolic BP Percentile --      Diastolic BP Percentile --      Pulse Rate 05/10/23 1250 60     Resp 05/10/23 1250 18     Temp 05/10/23 1251 99.1 F (37.3 C)     Temp Source 05/10/23 1251 Oral     SpO2 05/10/23 1250 99 %     Weight 05/10/23 1251 178 lb 9.2 oz (81 kg)     Height 05/10/23 1251 5\' 4"  (1.626 m)     Head Circumference --      Peak Flow --      Pain Score 05/10/23 1251 7     Pain Loc --      Pain Education --      Exclude from Growth Chart --     Most recent vital signs: Vitals:   05/10/23 1251 05/10/23 1807  BP:  130/88  Pulse:  67  Resp:  18  Temp: 99.1 F (37.3 C) 98.7 F (37.1 C)  SpO2:  99%     General: Awake, no distress.   CV:  Good peripheral perfusion. regular rate and  rhythm Resp:  Normal effort. Lungs cta Abd:  No distention.  Tender left lower quadrant, no CVA tenderness Other:      ED Results / Procedures / Treatments   Labs (all labs ordered are listed, but only abnormal results are displayed) Labs Reviewed  COMPREHENSIVE METABOLIC PANEL - Abnormal; Notable for the following components:      Result Value   Glucose, Bld 103 (*)    Calcium 8.8 (*)    All other components within normal  limits  URINALYSIS, ROUTINE W REFLEX MICROSCOPIC - Abnormal; Notable for the following components:   Color, Urine YELLOW (*)    APPearance HAZY (*)    Hgb urine dipstick LARGE (*)    Bacteria, UA RARE (*)    All other components within normal limits  LIPASE, BLOOD  CBC WITH DIFFERENTIAL/PLATELET  PREGNANCY, URINE  HCG, QUANTITATIVE, PREGNANCY     EKG     RADIOLOGY Ultrasound pelvis for torsion    PROCEDURES:   Procedures   MEDICATIONS ORDERED IN ED: Medications - No data to display   IMPRESSION / MDM / ASSESSMENT AND PLAN / ED COURSE  I reviewed the triage vital signs and the nursing notes.                              Differential diagnosis includes, but is not limited to, muscle strain, ovarian cyst, ovarian torsion  Patient's presentation is most  consistent with acute illness / injury with system symptoms.   Patient's labs are reassuring  Ultrasound pelvis for torsion  Care transferred to [facility], PA-C      FINAL CLINICAL IMPRESSION(S) / ED DIAGNOSES   Final diagnoses:  Pelvic pain     Rx / DC Orders   ED Discharge Orders     None        Note:  This document was prepared using Dragon voice recognition software and may include unintentional dictation errors.    Faythe Ghee, PA-C 05/10/23 1942    Concha Se, MD 05/10/23 [phone]

## 2023-05-10 NOTE — ED Notes (Signed)
See triage note  Presents with pain to left lower quad and flank area States pain started on Sunday Low grade temp on arrival

## 2023-05-10 NOTE — ED Triage Notes (Addendum)
Patient to ED via POV for left sided flank pain. States started Sunday night. Denies N/V/D. States when she breathes she can "feel it pull." Denies urinary symptoms. States she is currently on period.

## 2023-05-10 NOTE — ED Provider Triage Note (Signed)
Emergency Medicine Provider Triage Evaluation Note  Diane Beasley , a 27 y.o. female  was evaluated in triage.  Pt complains of left sided flank pain that began Sunday night.   Review of Systems  Positive: Flank pain, nausea Negative: Urinary symptoms, V/D/C  Physical Exam  There were no vitals taken for this visit. Gen:   Awake, no distress   Resp:  Normal effort  MSK:   Moves extremities without difficulty  Other:    Medical Decision Making  Medically screening exam initiated at 12:49 PM.  Appropriate orders placed.  Diane Beasley was informed that the remainder of the evaluation will be completed by another provider, this initial triage assessment does not replace that evaluation, and the importance of remaining in the ED until their evaluation is complete.     Cameron Ali, PA-C 05/10/23 1250

## 2023-05-10 NOTE — ED Provider Notes (Signed)
-----------------------------------------   10:27 PM on 05/10/2023 -----------------------------------------  Blood pressure 130/88, pulse 67, temperature 98.7 F (37.1 C), temperature source Oral, resp. rate 18, height 5\' 4"  (1.626 m), weight 81 kg, SpO2 99%.  Assuming care from Diane Right, PA-C.  In short, Diane Beasley is Beasley 27 y.o. female with Beasley chief complaint of Flank Pain .  Refer to the original H&P for additional details.  The current plan of care is to awaiting ultrasound.  If negative can discharge.   ----------------------------------------- 9:27 PM on 05/10/2023 -----------------------------------------  Ultrasound is negative for ovarian torsion.  On reassessment patient is asking for pain medicine.  Toradol, lidocaine patch and muscle relaxer provided.  Patient is in stable and satisfactory condition for discharge home.  Encouraged to follow-up with her primary care as needed.   Diane Beasley, Diane Llanas A, PA-C 05/10/23 2228    Diane Se, Diane Beasley 05/10/23 [phone]
